# Patient Record
Sex: Female | Born: 1965 | Race: Black or African American | Hispanic: No | Smoking: Current some day smoker
Health system: Southern US, Community
[De-identification: ages and names within clinical notes are randomized; demographics above are authoritative.]

## PROBLEM LIST (undated history)

## (undated) ENCOUNTER — Emergency Department (HOSPITAL_BASED_OUTPATIENT_CLINIC_OR_DEPARTMENT_OTHER): Admission: EM | Payer: Self-pay | Source: Home / Self Care

## (undated) DIAGNOSIS — F419 Anxiety disorder, unspecified: Secondary | ICD-10-CM

## (undated) DIAGNOSIS — I10 Essential (primary) hypertension: Secondary | ICD-10-CM

## (undated) DIAGNOSIS — M549 Dorsalgia, unspecified: Secondary | ICD-10-CM

## (undated) DIAGNOSIS — M1990 Unspecified osteoarthritis, unspecified site: Secondary | ICD-10-CM

## (undated) DIAGNOSIS — A599 Trichomoniasis, unspecified: Secondary | ICD-10-CM

## (undated) DIAGNOSIS — M25569 Pain in unspecified knee: Secondary | ICD-10-CM

## (undated) DIAGNOSIS — G8929 Other chronic pain: Secondary | ICD-10-CM

## (undated) DIAGNOSIS — M2550 Pain in unspecified joint: Secondary | ICD-10-CM

## (undated) DIAGNOSIS — F1410 Cocaine abuse, uncomplicated: Secondary | ICD-10-CM

## (undated) DIAGNOSIS — E7800 Pure hypercholesterolemia, unspecified: Secondary | ICD-10-CM

## (undated) HISTORY — PX: ABDOMINAL HYSTERECTOMY: SHX81

---

## 1997-09-20 ENCOUNTER — Encounter: Admission: RE | Admit: 1997-09-20 | Discharge: 1997-09-20 | Payer: Self-pay | Admitting: Obstetrics

## 1998-01-24 ENCOUNTER — Encounter: Admission: RE | Admit: 1998-01-24 | Discharge: 1998-01-24 | Payer: Self-pay | Admitting: Obstetrics

## 1998-06-04 ENCOUNTER — Encounter: Admission: RE | Admit: 1998-06-04 | Discharge: 1998-06-04 | Payer: Self-pay | Admitting: Obstetrics & Gynecology

## 1998-06-28 ENCOUNTER — Encounter: Admission: RE | Admit: 1998-06-28 | Discharge: 1998-06-28 | Payer: Self-pay | Admitting: Obstetrics

## 2000-05-10 ENCOUNTER — Encounter: Admission: RE | Admit: 2000-05-10 | Discharge: 2000-06-10 | Payer: Self-pay | Admitting: Orthopedic Surgery

## 2013-12-26 ENCOUNTER — Other Ambulatory Visit: Payer: Self-pay | Admitting: Obstetrics & Gynecology

## 2013-12-26 ENCOUNTER — Ambulatory Visit (HOSPITAL_BASED_OUTPATIENT_CLINIC_OR_DEPARTMENT_OTHER)
Admission: RE | Admit: 2013-12-26 | Discharge: 2013-12-26 | Disposition: A | Discharge status: Home / Self Care | Payer: Self-pay | Source: Ambulatory Visit | Attending: Obstetrics & Gynecology | Admitting: Obstetrics & Gynecology

## 2013-12-26 DIAGNOSIS — Z1231 Encounter for screening mammogram for malignant neoplasm of breast: Secondary | ICD-10-CM

## 2013-12-28 ENCOUNTER — Other Ambulatory Visit: Payer: Self-pay | Admitting: Obstetrics & Gynecology

## 2013-12-28 DIAGNOSIS — R928 Other abnormal and inconclusive findings on diagnostic imaging of breast: Secondary | ICD-10-CM

## 2014-01-08 ENCOUNTER — Other Ambulatory Visit: Payer: Self-pay

## 2014-01-16 ENCOUNTER — Other Ambulatory Visit: Payer: Self-pay

## 2014-01-18 ENCOUNTER — Other Ambulatory Visit: Payer: Self-pay

## 2014-01-30 ENCOUNTER — Other Ambulatory Visit (HOSPITAL_COMMUNITY): Payer: Self-pay | Admitting: *Deleted

## 2014-01-30 DIAGNOSIS — R928 Other abnormal and inconclusive findings on diagnostic imaging of breast: Secondary | ICD-10-CM

## 2014-02-21 ENCOUNTER — Other Ambulatory Visit: Payer: Self-pay

## 2014-02-21 ENCOUNTER — Ambulatory Visit (HOSPITAL_COMMUNITY): Payer: Self-pay | Attending: Obstetrics and Gynecology

## 2014-03-20 ENCOUNTER — Other Ambulatory Visit: Payer: Self-pay

## 2014-04-04 ENCOUNTER — Other Ambulatory Visit: Payer: Self-pay

## 2014-04-04 ENCOUNTER — Ambulatory Visit (HOSPITAL_COMMUNITY): Payer: Self-pay | Attending: Obstetrics and Gynecology

## 2014-04-26 ENCOUNTER — Emergency Department (HOSPITAL_BASED_OUTPATIENT_CLINIC_OR_DEPARTMENT_OTHER)
Admission: EM | Admit: 2014-04-26 | Discharge: 2014-04-26 | Disposition: A | Discharge status: Home / Self Care | Payer: Self-pay | Attending: Emergency Medicine | Admitting: Emergency Medicine

## 2014-04-26 ENCOUNTER — Encounter (HOSPITAL_BASED_OUTPATIENT_CLINIC_OR_DEPARTMENT_OTHER): Payer: Self-pay | Admitting: *Deleted

## 2014-04-26 DIAGNOSIS — M545 Low back pain, unspecified: Secondary | ICD-10-CM

## 2014-04-26 DIAGNOSIS — Z8639 Personal history of other endocrine, nutritional and metabolic disease: Secondary | ICD-10-CM | POA: Insufficient documentation

## 2014-04-26 DIAGNOSIS — I10 Essential (primary) hypertension: Secondary | ICD-10-CM | POA: Insufficient documentation

## 2014-04-26 DIAGNOSIS — G8929 Other chronic pain: Secondary | ICD-10-CM | POA: Insufficient documentation

## 2014-04-26 DIAGNOSIS — Z720 Tobacco use: Secondary | ICD-10-CM | POA: Insufficient documentation

## 2014-04-26 HISTORY — DX: Essential (primary) hypertension: I10

## 2014-04-26 HISTORY — DX: Other chronic pain: G89.29

## 2014-04-26 HISTORY — DX: Pure hypercholesterolemia, unspecified: E78.00

## 2014-04-26 HISTORY — DX: Dorsalgia, unspecified: M54.9

## 2014-04-26 MED ORDER — HYDROMORPHONE HCL 1 MG/ML IJ SOLN
1.0000 mg | Freq: Once | INTRAMUSCULAR | Status: AC
  Administered 2014-04-26: 1 mg via INTRAMUSCULAR
  Filled 2014-04-26: qty 1

## 2014-04-26 MED ORDER — HYDROCODONE-ACETAMINOPHEN 5-325 MG PO TABS: 1.0000 | ORAL_TABLET | Freq: Four times a day (QID) | ORAL | Status: DC | PRN

## 2014-04-26 NOTE — ED Notes (Signed)
Chronic back pain. Here today for flare up. Pain goes down her left leg.

## 2014-04-26 NOTE — ED Provider Notes (Signed)
CSN: [id number]     Arrival date & time 04/26/14  1322 History   First MD Initiated Contact with Patient 04/26/14 1332     Chief Complaint  Patient presents with  . Back Pain     (Consider location/radiation/quality/duration/timing/severity/associated sxs/prior Treatment) Patient is a 49 y.o. female presenting with back pain. The history is provided by the patient.  Back Pain Location:  Lumbar spine Quality:  Aching Radiates to:  L thigh Pain severity:  Moderate Pain is:  Same all the time Onset quality:  Gradual Timing:  Constant Progression:  Worsening Chronicity:  Chronic Context comment:  Worse today Relieved by:  Nothing Worsened by:  Ambulation Ineffective treatments:  None tried Associated symptoms: no abdominal pain, no chest pain, no dysuria, no fever and no headaches     Past Medical History  Diagnosis Date  . Chronic back pain   . Hypertension   . High cholesterol    Past Surgical History  Procedure Laterality Date  . Abdominal hysterectomy     No family history on file. History  Substance Use Topics  . Smoking status: Current Some Day Smoker    Types: Cigarettes  . Smokeless tobacco: Not on file  . Alcohol Use: No   OB History    No data available     Review of Systems  Constitutional: Negative for fever and fatigue.  HENT: Negative for congestion and drooling.   Eyes: Negative for pain.  Respiratory: Negative for cough and shortness of breath.   Cardiovascular: Negative for chest pain.  Gastrointestinal: Negative for nausea, vomiting, abdominal pain and diarrhea.  Genitourinary: Negative for dysuria and hematuria.  Musculoskeletal: Negative for back pain, gait problem and neck pain.  Skin: Negative for color change.  Neurological: Negative for dizziness and headaches.  Hematological: Negative for adenopathy.  Psychiatric/Behavioral: Negative for behavioral problems.  All other systems reviewed and are negative.     Allergies  Review of  patient's allergies indicates no known allergies.  Home Medications   Prior to Admission medications   Medication Sig Start Date End Date Taking? Authorizing Provider  AmLODIPine Besylate (NORVASC PO) Take by mouth.   Yes Historical Provider, MD  Cyclobenzaprine HCl (FLEXERIL PO) Take by mouth.   Yes Historical Provider, MD  HYDROCHLOROTHIAZIDE PO Take by mouth.   Yes Historical Provider, MD  SIMVASTATIN PO Take by mouth.   Yes Historical Provider, MD  HYDROcodone-acetaminophen (NORCO) 5-325 MG per tablet Take 1 tablet by mouth every 6 (six) hours as needed for moderate pain. 04/26/14   Purvis Sheffield, MD   BP 174/115 mmHg  Pulse 87  Temp(Src) 98.6 F (37 C) (Oral)  Resp 18  Ht  (1.676 m)  Wt 202 lb (91.627 kg)  BMI 32.62 kg/m2  SpO2 99% Physical Exam  Constitutional: She is oriented to person, place, and time. She appears well-developed and well-nourished.  HENT:  Head: Normocephalic.  Mouth/Throat: Oropharynx is clear and moist. No oropharyngeal exudate.  Eyes: Conjunctivae and EOM are normal. Pupils are equal, round, and reactive to light.  Neck: Normal range of motion. Neck supple.  Cardiovascular: Normal rate, regular rhythm, normal heart sounds and intact distal pulses.  Exam reveals no gallop and no friction rub.   No murmur heard. Pulmonary/Chest: Effort normal and breath sounds normal. No respiratory distress. She has no wheezes.  Abdominal: Soft. Bowel sounds are normal. There is no tenderness. There is no rebound and no guarding.  Musculoskeletal: Normal range of motion. She exhibits tenderness. She  exhibits no edema.  Mild tenderness to palpation of the lower lumbar spine and lower paralumbar area bilaterally.  Normal strength and sensation in bilateral lower extremities.  2+ distal pulses in bilateral lower extremities.  Neurological: She is alert and oriented to person, place, and time.  Reflex Scores:      Patellar reflexes are 2+ on the right side and 2+  on the left side.      Achilles reflexes are 2+ on the right side and 2+ on the left side. Skin: Skin is warm and dry.  Psychiatric: She has a normal mood and affect. Her behavior is normal.  Nursing note and vitals reviewed.   ED Course  Procedures (including critical care time) Labs Review Labs Reviewed - No data to display  Imaging Review No results found.   EKG Interpretation None      MDM   Final diagnoses:  Acute exacerbation of chronic low back pain    1:48 PM 49 y.o. female with a history of degenerative disc disease who presents with low back pain. She notes acute worsening today. She states that it radiates down her left lateral thigh to her knee. She has had similar symptoms previously. No new injury. She states that she used to follow with a pain clinic but can no longer afford it. She states that she has a history of diabetes but no other back pain red flags. She is mildly hypertensive here but otherwise appears well. Will refer her to the wellness clinic in [place]. Back pain red flags discussed.   Medications given during this visit Medications  HYDROmorphone (DILAUDID) injection 1 mg (not administered)    New Prescriptions   HYDROCODONE-ACETAMINOPHEN (NORCO) 5-325 MG PER TABLET    Take 1 tablet by mouth every 6 (six) hours as needed for moderate pain.       Purvis SheffieldForrest Lenee Franze, MD 04/26/14 1351

## 2014-05-03 ENCOUNTER — Other Ambulatory Visit: Payer: Self-pay

## 2014-05-03 ENCOUNTER — Ambulatory Visit (HOSPITAL_COMMUNITY): Payer: Self-pay | Attending: Obstetrics and Gynecology

## 2014-10-03 ENCOUNTER — Encounter (HOSPITAL_BASED_OUTPATIENT_CLINIC_OR_DEPARTMENT_OTHER): Payer: Self-pay | Admitting: *Deleted

## 2014-10-03 ENCOUNTER — Emergency Department (HOSPITAL_BASED_OUTPATIENT_CLINIC_OR_DEPARTMENT_OTHER)
Admission: EM | Admit: 2014-10-03 | Discharge: 2014-10-04 | Disposition: A | Discharge status: Home / Self Care | Payer: Self-pay | Attending: Emergency Medicine | Admitting: Emergency Medicine

## 2014-10-03 DIAGNOSIS — I10 Essential (primary) hypertension: Secondary | ICD-10-CM | POA: Insufficient documentation

## 2014-10-03 DIAGNOSIS — F1010 Alcohol abuse, uncomplicated: Secondary | ICD-10-CM | POA: Insufficient documentation

## 2014-10-03 DIAGNOSIS — M5431 Sciatica, right side: Secondary | ICD-10-CM | POA: Insufficient documentation

## 2014-10-03 DIAGNOSIS — Z79899 Other long term (current) drug therapy: Secondary | ICD-10-CM | POA: Insufficient documentation

## 2014-10-03 DIAGNOSIS — G8929 Other chronic pain: Secondary | ICD-10-CM | POA: Insufficient documentation

## 2014-10-03 DIAGNOSIS — E780 Pure hypercholesterolemia: Secondary | ICD-10-CM | POA: Insufficient documentation

## 2014-10-03 DIAGNOSIS — Z720 Tobacco use: Secondary | ICD-10-CM | POA: Insufficient documentation

## 2014-10-03 MED ORDER — DEXAMETHASONE SODIUM PHOSPHATE 10 MG/ML IJ SOLN
10.0000 mg | Freq: Once | INTRAMUSCULAR | Status: AC
  Administered 2014-10-03: 10 mg via INTRAMUSCULAR
  Filled 2014-10-03: qty 1

## 2014-10-03 MED ORDER — KETOROLAC TROMETHAMINE 60 MG/2ML IM SOLN
60.0000 mg | Freq: Once | INTRAMUSCULAR | Status: AC
  Administered 2014-10-03: 60 mg via INTRAMUSCULAR
  Filled 2014-10-03: qty 2

## 2014-10-03 MED ORDER — METHOCARBAMOL 500 MG PO TABS
1000.0000 mg | ORAL_TABLET | Freq: Once | ORAL | Status: AC
  Administered 2014-10-03: 1000 mg via ORAL
  Filled 2014-10-03: qty 2

## 2014-10-03 NOTE — ED Provider Notes (Signed)
CSN: [id number]     Arrival date & time 10/03/14  2135 History  This chart was scribed for Ansh Fauble, MD by Lyndel Safe, ED Scribe. This patient was seen in room MH07/MH07 and the patient's care was started 11:14 PM.   Chief Complaint  Patient presents with  . Back Pain   Patient is a 49 y.o. female presenting with back pain. The history is provided by the patient. No language interpreter was used.  Back Pain Location:  Sacro-iliac joint and lumbar spine Quality:  Shooting Radiates to:  R posterior upper leg Pain severity:  Moderate Pain is:  Same all the time Onset quality:  Gradual Duration:  4 days Timing:  Constant Progression:  Unchanged Chronicity:  Chronic Context: not falling and not recent injury   Relieved by:  None tried Worsened by:  Nothing tried Ineffective treatments:  None tried Associated symptoms: no abdominal pain, no abdominal swelling, no bladder incontinence, no bowel incontinence, no chest pain, no dysuria, no fever, no headaches, no leg pain, no numbness, no paresthesias, no pelvic pain, no perianal numbness, no tingling, no weakness and no weight loss   Risk factors: no hx of cancer    HPI Comments: Amy Gibbs is a 49 y.o. female, with a PMhx of sciatica, chronic back pain, HTN, and HLD, who presents to the Emergency Department complaining of constant, moderate, shooting lower back pain that radiates down her right leg onset 4 days ago. She is prescribed flexeril and oxycodone for pain management. She was being evaluated at a pain management facility but is not currently due to insurance issues. She does not attribute any injury to the pain.   Past Medical History  Diagnosis Date  . Chronic back pain   . Hypertension   . High cholesterol    Past Surgical History  Procedure Laterality Date  . Abdominal hysterectomy     History reviewed. No pertinent family history. History  Substance Use Topics  . Smoking status: Current Some Day Smoker --  0.50 packs/day    Types: Cigarettes  . Smokeless tobacco: Not on file  . Alcohol Use: No   OB History    No data available     Review of Systems  Constitutional: Negative for fever and weight loss.  Cardiovascular: Negative for chest pain.  Gastrointestinal: Negative for abdominal pain and bowel incontinence.  Genitourinary: Negative for bladder incontinence, dysuria, difficulty urinating and pelvic pain.  Musculoskeletal: Positive for back pain and arthralgias.  Neurological: Negative for tingling, weakness, numbness, headaches and paresthesias.  All other systems reviewed and are negative.   Allergies  Review of patient's allergies indicates no known allergies.  Home Medications   Prior to Admission medications   Medication Sig Start Date End Date Taking? Authorizing Provider  AmLODIPine Besylate (NORVASC PO) Take by mouth.    Historical Provider, MD  Cyclobenzaprine HCl (FLEXERIL PO) Take by mouth.    Historical Provider, MD  HYDROCHLOROTHIAZIDE PO Take by mouth.    Historical Provider, MD  HYDROcodone-acetaminophen (NORCO) 5-325 MG per tablet Take 1 tablet by mouth every 6 (six) hours as needed for moderate pain. 04/26/14   Purvis Sheffield, MD  SIMVASTATIN PO Take by mouth.    Historical Provider, MD   BP 131/100 mmHg  Pulse 88  Temp(Src) 98.4 F (36.9 C) (Oral)  Resp 18  Ht  (1.727 m)  Wt 256 lb (116.121 kg)  BMI 38.93 kg/m2  SpO2 97% Physical Exam  Constitutional: She is oriented to person,  place, and time. She appears well-developed and well-nourished. No distress.  Pt smells strongly of alcohol.   HENT:  Head: Normocephalic and atraumatic.  Mouth/Throat: Oropharynx is clear and moist. No oropharyngeal exudate.  Eyes: Conjunctivae and EOM are normal. Pupils are equal, round, and reactive to light. Right eye exhibits no discharge. Left eye exhibits no discharge. No scleral icterus.  Neck: Normal range of motion. Neck supple. No JVD present.  Cardiovascular:  Normal rate, regular rhythm and normal heart sounds.   3+ DP pulse.   Pulmonary/Chest: Effort normal and breath sounds normal. No respiratory distress. She has no wheezes.  Abdominal: Soft. Bowel sounds are normal. She exhibits no mass. There is no tenderness. There is no rebound and no guarding.  Musculoskeletal: She exhibits no edema.       Lumbar back: She exhibits normal range of motion, no tenderness, no bony tenderness, no swelling, no edema, no deformity, no laceration, no pain, no spasm and normal pulse.  Neurological: She is alert and oriented to person, place, and time. She has normal reflexes. Coordination normal.  L5 and S1 intact perineal sensation. Good reflexes.   Skin: Skin is warm and dry. No rash noted. No erythema. No pallor.  Psychiatric: She has a normal mood and affect. Her behavior is normal.  Nursing note and vitals reviewed.   ED Course  Procedures  DIAGNOSTIC STUDIES: Oxygen Saturation is 97% on RA, normal by my interpretation.    COORDINATION OF CARE: 11:17 PM Discussed treatment plan which includes to order diagnostic imaging  with pt. Pt acknowledges and agrees to plan.   Labs Review Labs Reviewed - No data to display  Imaging Review No results found.   EKG Interpretation None      MDM   Final diagnoses:  None    Chronic pain: Will treat with toradol and robaxin.  As this is an ongoing issue there is no indication for additional imaging at this time  I personally performed the services described in this documentation, which was scribed in my presence. The recorded information has been reviewed and is accurate.     Cy BlamerApril Ricahrd Schwager, MD 10/04/14 [id number]

## 2014-10-03 NOTE — ED Notes (Signed)
Pt c/o lower back pain which radiates down right leg x 6 days

## 2014-10-04 ENCOUNTER — Encounter (HOSPITAL_BASED_OUTPATIENT_CLINIC_OR_DEPARTMENT_OTHER): Payer: Self-pay | Admitting: Emergency Medicine

## 2014-10-04 MED ORDER — METHOCARBAMOL 500 MG PO TABS
500.0000 mg | ORAL_TABLET | Freq: Two times a day (BID) | ORAL | Status: DC
Start: 2014-10-04 — End: 2015-12-26

## 2014-10-04 MED ORDER — MELOXICAM 15 MG PO TABS: 15.0000 mg | ORAL_TABLET | Freq: Every day | ORAL | Status: DC

## 2014-10-04 NOTE — Discharge Instructions (Signed)
Back Exercises These exercises may help you when beginning to rehabilitate your injury. Your symptoms may resolve with or without further involvement from your physician, physical therapist or athletic trainer. While completing these exercises, remember:   Restoring tissue flexibility helps normal motion to return to the joints. This allows healthier, less painful movement and activity.  An effective stretch should be held for at least 30 seconds.  A stretch should never be painful. You should only feel a gentle lengthening or release in the stretched tissue. STRETCH - Extension, Prone on Elbows   Lie on your stomach on the floor, a bed will be too soft. Place your palms about shoulder width apart and at the height of your head.  Place your elbows under your shoulders. If this is too painful, stack pillows under your chest.  Allow your body to relax so that your hips drop lower and make contact more completely with the floor.  Hold this position for __________ seconds.  Slowly return to lying flat on the floor. Repeat __________ times. Complete this exercise __________ times per day.  RANGE OF MOTION - Extension, Prone Press Ups   Lie on your stomach on the floor, a bed will be too soft. Place your palms about shoulder width apart and at the height of your head.  Keeping your back as relaxed as possible, slowly straighten your elbows while keeping your hips on the floor. You may adjust the placement of your hands to maximize your comfort. As you gain motion, your hands will come more underneath your shoulders.  Hold this position __________ seconds.  Slowly return to lying flat on the floor. Repeat __________ times. Complete this exercise __________ times per day.  RANGE OF MOTION- Quadruped, Neutral Spine   Assume a hands and knees position on a firm surface. Keep your hands under your shoulders and your knees under your hips. You may place padding under your knees for  comfort.  Drop your head and point your tail bone toward the ground below you. This will round out your low back like an angry cat. Hold this position for __________ seconds.  Slowly lift your head and release your tail bone so that your back sags into a large arch, like an old horse.  Hold this position for __________ seconds.  Repeat this until you feel limber in your low back.  Now, find your "sweet spot." This will be the most comfortable position somewhere between the two previous positions. This is your neutral spine. Once you have found this position, tense your stomach muscles to support your low back.  Hold this position for __________ seconds. Repeat __________ times. Complete this exercise __________ times per day.  STRETCH - Flexion, Single Knee to Chest   Lie on a firm bed or floor with both legs extended in front of you.  Keeping one leg in contact with the floor, bring your opposite knee to your chest. Hold your leg in place by either grabbing behind your thigh or at your knee.  Pull until you feel a gentle stretch in your low back. Hold __________ seconds.  Slowly release your grasp and repeat the exercise with the opposite side. Repeat __________ times. Complete this exercise __________ times per day.  STRETCH - Hamstrings, Standing  Stand or sit and extend your right / left leg, placing your foot on a chair or foot stool  Keeping a slight arch in your low back and your hips straight forward.  Lead with your chest and   lean forward at the waist until you feel a gentle stretch in the back of your right / left knee or thigh. (When done correctly, this exercise requires leaning only a small distance.)  Hold this position for __________ seconds. Repeat __________ times. Complete this stretch __________ times per day. STRENGTHENING - Deep Abdominals, Pelvic Tilt   Lie on a firm bed or floor. Keeping your legs in front of you, bend your knees so they are both pointed  toward the ceiling and your feet are flat on the floor.  Tense your lower abdominal muscles to press your low back into the floor. This motion will rotate your pelvis so that your tail bone is scooping upwards rather than pointing at your feet or into the floor.  With a gentle tension and even breathing, hold this position for __________ seconds. Repeat __________ times. Complete this exercise __________ times per day.  STRENGTHENING - Abdominals, Crunches   Lie on a firm bed or floor. Keeping your legs in front of you, bend your knees so they are both pointed toward the ceiling and your feet are flat on the floor. Cross your arms over your chest.  Slightly tip your chin down without bending your neck.  Tense your abdominals and slowly lift your trunk high enough to just clear your shoulder blades. Lifting higher can put excessive stress on the low back and does not further strengthen your abdominal muscles.  Control your return to the starting position. Repeat __________ times. Complete this exercise __________ times per day.  STRENGTHENING - Quadruped, Opposite UE/LE Lift   Assume a hands and knees position on a firm surface. Keep your hands under your shoulders and your knees under your hips. You may place padding under your knees for comfort.  Find your neutral spine and gently tense your abdominal muscles so that you can maintain this position. Your shoulders and hips should form a rectangle that is parallel with the floor and is not twisted.  Keeping your trunk steady, lift your right hand no higher than your shoulder and then your left leg no higher than your hip. Make sure you are not holding your breath. Hold this position __________ seconds.  Continuing to keep your abdominal muscles tense and your back steady, slowly return to your starting position. Repeat with the opposite arm and leg. Repeat __________ times. Complete this exercise __________ times per day. Document Released:  03/20/2005 Document Revised: 05/25/2011 Document Reviewed: 06/14/2008 [employer] Patient Information 2015 [employer]. This information is not intended to replace advice given to you by your health care provider. Make sure you discuss any questions you have with your health care provider.  

## 2015-12-26 ENCOUNTER — Encounter (HOSPITAL_BASED_OUTPATIENT_CLINIC_OR_DEPARTMENT_OTHER): Payer: Self-pay

## 2015-12-26 ENCOUNTER — Emergency Department (HOSPITAL_BASED_OUTPATIENT_CLINIC_OR_DEPARTMENT_OTHER): Payer: Self-pay

## 2015-12-26 ENCOUNTER — Inpatient Hospital Stay (HOSPITAL_BASED_OUTPATIENT_CLINIC_OR_DEPARTMENT_OTHER)
Admission: EM | Admit: 2015-12-26 | Discharge: 2016-01-02 | DRG: 546 | Disposition: A | Discharge status: Home / Self Care | Payer: Self-pay | Attending: Internal Medicine | Admitting: Internal Medicine

## 2015-12-26 DIAGNOSIS — I10 Essential (primary) hypertension: Secondary | ICD-10-CM | POA: Diagnosis present

## 2015-12-26 DIAGNOSIS — E7800 Pure hypercholesterolemia, unspecified: Secondary | ICD-10-CM | POA: Diagnosis present

## 2015-12-26 DIAGNOSIS — M25569 Pain in unspecified knee: Secondary | ICD-10-CM

## 2015-12-26 DIAGNOSIS — M02362 Reiter's disease, left knee: Secondary | ICD-10-CM | POA: Diagnosis present

## 2015-12-26 DIAGNOSIS — M0029 Other streptococcal polyarthritis: Secondary | ICD-10-CM | POA: Diagnosis present

## 2015-12-26 DIAGNOSIS — M25561 Pain in right knee: Secondary | ICD-10-CM

## 2015-12-26 DIAGNOSIS — D72829 Elevated white blood cell count, unspecified: Secondary | ICD-10-CM | POA: Diagnosis present

## 2015-12-26 DIAGNOSIS — M02361 Reiter's disease, right knee: Principal | ICD-10-CM | POA: Diagnosis present

## 2015-12-26 DIAGNOSIS — Z599 Problem related to housing and economic circumstances, unspecified: Secondary | ICD-10-CM

## 2015-12-26 DIAGNOSIS — N179 Acute kidney failure, unspecified: Secondary | ICD-10-CM | POA: Diagnosis present

## 2015-12-26 DIAGNOSIS — Z8261 Family history of arthritis: Secondary | ICD-10-CM

## 2015-12-26 DIAGNOSIS — M25461 Effusion, right knee: Secondary | ICD-10-CM

## 2015-12-26 DIAGNOSIS — Z9114 Patient's other noncompliance with medication regimen: Secondary | ICD-10-CM

## 2015-12-26 DIAGNOSIS — A599 Trichomoniasis, unspecified: Secondary | ICD-10-CM | POA: Diagnosis present

## 2015-12-26 DIAGNOSIS — J028 Acute pharyngitis due to other specified organisms: Secondary | ICD-10-CM

## 2015-12-26 DIAGNOSIS — M009 Pyogenic arthritis, unspecified: Secondary | ICD-10-CM

## 2015-12-26 DIAGNOSIS — F1410 Cocaine abuse, uncomplicated: Secondary | ICD-10-CM | POA: Diagnosis present

## 2015-12-26 DIAGNOSIS — G894 Chronic pain syndrome: Secondary | ICD-10-CM | POA: Diagnosis present

## 2015-12-26 DIAGNOSIS — Z79899 Other long term (current) drug therapy: Secondary | ICD-10-CM

## 2015-12-26 DIAGNOSIS — F419 Anxiety disorder, unspecified: Secondary | ICD-10-CM | POA: Diagnosis present

## 2015-12-26 DIAGNOSIS — M25562 Pain in left knee: Secondary | ICD-10-CM

## 2015-12-26 DIAGNOSIS — G8929 Other chronic pain: Secondary | ICD-10-CM | POA: Diagnosis present

## 2015-12-26 DIAGNOSIS — A419 Sepsis, unspecified organism: Secondary | ICD-10-CM | POA: Diagnosis present

## 2015-12-26 DIAGNOSIS — F17210 Nicotine dependence, cigarettes, uncomplicated: Secondary | ICD-10-CM | POA: Diagnosis present

## 2015-12-26 DIAGNOSIS — M25462 Effusion, left knee: Secondary | ICD-10-CM

## 2015-12-26 DIAGNOSIS — L03119 Cellulitis of unspecified part of limb: Secondary | ICD-10-CM

## 2015-12-26 HISTORY — DX: Cocaine abuse, uncomplicated: F14.10

## 2015-12-26 HISTORY — DX: Pain in unspecified joint: M25.50

## 2015-12-26 HISTORY — DX: Other chronic pain: G89.29

## 2015-12-26 HISTORY — DX: Trichomoniasis, unspecified: A59.9

## 2015-12-26 HISTORY — DX: Unspecified osteoarthritis, unspecified site: M19.90

## 2015-12-26 HISTORY — DX: Anxiety disorder, unspecified: F41.9

## 2015-12-26 HISTORY — DX: Pain in unspecified knee: M25.569

## 2015-12-26 LAB — CBC WITH DIFFERENTIAL/PLATELET
Basophils Absolute: 0 10*3/uL (ref 0.0–0.1)
Basophils Relative: 0 %
EOS ABS: 0.1 10*3/uL (ref 0.0–0.7)
EOS PCT: 1 %
HCT: 36.9 % (ref 36.0–46.0)
Hemoglobin: 12.2 g/dL (ref 12.0–15.0)
LYMPHS ABS: 3.3 10*3/uL (ref 0.7–4.0)
Lymphocytes Relative: 25 %
MCH: 30.7 pg (ref 26.0–34.0)
MCHC: 33.1 g/dL (ref 30.0–36.0)
MCV: 92.9 fL (ref 78.0–100.0)
MONO ABS: 1.2 10*3/uL — AB (ref 0.1–1.0)
Monocytes Relative: 9 %
NEUTROS ABS: 8.4 10*3/uL — AB (ref 1.7–7.7)
NEUTROS PCT: 65 %
Platelets: 331 10*3/uL (ref 150–400)
RBC: 3.97 MIL/uL (ref 3.87–5.11)
RDW: 14.7 % (ref 11.5–15.5)
WBC: 13 10*3/uL — ABNORMAL HIGH (ref 4.0–10.5)

## 2015-12-26 LAB — BASIC METABOLIC PANEL
Anion gap: 8 (ref 5–15)
BUN: 24 mg/dL — AB (ref 6–20)
CALCIUM: 9.3 mg/dL (ref 8.9–10.3)
CHLORIDE: 105 mmol/L (ref 101–111)
CO2: 25 mmol/L (ref 22–32)
CREATININE: 1.98 mg/dL — AB (ref 0.44–1.00)
GFR calc Af Amer: 33 mL/min — ABNORMAL LOW (ref >60)
GFR calc non Af Amer: 28 mL/min — ABNORMAL LOW (ref >60)
Glucose, Bld: 129 mg/dL — ABNORMAL HIGH (ref 65–99)
Potassium: 3.6 mmol/L (ref 3.5–5.1)
SODIUM: 138 mmol/L (ref 135–145)

## 2015-12-26 LAB — C-REACTIVE PROTEIN: CRP: 10.4 mg/dL — AB (ref <1.0)

## 2015-12-26 LAB — SEDIMENTATION RATE: SED RATE: 82 mm/h — AB (ref 0–22)

## 2015-12-26 MED ORDER — SODIUM CHLORIDE 0.9 % IV BOLUS (SEPSIS)
1000.0000 mL | Freq: Once | INTRAVENOUS | Status: AC
  Administered 2015-12-26: 1000 mL via INTRAVENOUS

## 2015-12-26 MED ORDER — AMLODIPINE BESYLATE 5 MG PO TABS
10.0000 mg | ORAL_TABLET | Freq: Once | ORAL | Status: AC
  Administered 2015-12-26: 10 mg via ORAL
  Filled 2015-12-26: qty 2

## 2015-12-26 MED ORDER — MORPHINE SULFATE (PF) 4 MG/ML IV SOLN
4.0000 mg | Freq: Once | INTRAVENOUS | Status: AC
  Administered 2015-12-26: 4 mg via INTRAVENOUS
  Filled 2015-12-26: qty 1

## 2015-12-26 MED ORDER — METHYLPREDNISOLONE SODIUM SUCC 125 MG IJ SOLR
125.0000 mg | Freq: Once | INTRAMUSCULAR | Status: AC
  Administered 2015-12-27: 125 mg via INTRAVENOUS
  Filled 2015-12-26: qty 2

## 2015-12-26 MED ORDER — DEXTROSE 5 % IV SOLN: 2.0000 g | Freq: Three times a day (TID) | INTRAVENOUS | Status: DC

## 2015-12-26 MED ORDER — VANCOMYCIN HCL IN DEXTROSE 1-5 GM/200ML-% IV SOLN: 1000.0000 mg | Freq: Once | INTRAVENOUS | Status: DC

## 2015-12-26 NOTE — ED Notes (Signed)
PA made aware that pt is requesting pain medication.

## 2015-12-26 NOTE — ED Notes (Signed)
Pt's BP 160/112. Pt states she hasn't had any BP medication in a long time.

## 2015-12-26 NOTE — ED Provider Notes (Signed)
[facility] Provider Note   CSN: [id number] Arrival date & time: 12/26/15  4098   By signing my name below, I, Amy Gibbs, attest that this documentation has been prepared under the direction and in the presence of non-physician practitioner, Danelle Berry, PA-C. Electronically Signed: Nelwyn Gibbs, Scribe. 12/26/2015. 8:16 PM.  History   Chief Complaint Chief Complaint  Patient presents with  . Joint Swelling   The history is provided by the patient. No language interpreter was used.    HPI Comments:  Amy Gibbs is a 50 y.o. female with PMHx of Arthritis and chronic knee pain who presents to the Emergency Department complaining of recurrent rapidly worsening constant bilateral knee pain and swelling that beginning last night. Pain is sharp and throbbing with left knee pain radiation to the back of her knee, right knee pain without radiation. Pain is rated 10/10 for the right, and 7/10 on the left. Her pain is exacerbated Movement, palpation or attempting to ambulate. She has associated erythema and warmth over both of her knees.  She has home narcotic pain medication, states there is no improvement with 10 mg of hydrocodone. She denies any street drug use. She states there is no injury prior to onset of her knee pain and swelling. She has mild cough and intermittent wheeze but she denies any shortness of breath. She denies any other pain complaints or illnesses. She denies fever, chills, sweats, nausea, vomiting. She states she has a history of arthritis in her knees with intermittent flares and swelling, however none in several years. Patient states she is unable to walk, straighten her legs or bend her legs because of the pain. She was assisted to the ER by 2 family members and she states that she is able to bear weight and transfer with her help.  No other associated or acute complaints.  Past Medical History:  Diagnosis Date  . Anxiety   . Chronic back pain   .  Chronic knee pain   . High cholesterol   . Hypertension   . Joint pain     Patient Active Problem List   Diagnosis Date Noted  . Cellulitis 12/27/2015    Past Surgical History:  Procedure Laterality Date  . ABDOMINAL HYSTERECTOMY      OB History    No data available       Home Medications    Prior to Admission medications   Medication Sig Start Date End Date Taking? Authorizing Provider  AmLODIPine Besylate (NORVASC PO) Take by mouth.    Historical Provider, MD  HYDROCHLOROTHIAZIDE PO Take by mouth.    Historical Provider, MD    Family History No family history on file.  Social History Social History  Substance Use Topics  . Smoking status: Current Some Day Smoker    Packs/day: 0.50    Types: Cigarettes  . Smokeless tobacco: Never Used  . Alcohol use No     Allergies   Review of patient's allergies indicates no known allergies.   Review of Systems Review of Systems  All other systems reviewed and are negative.    Physical Exam Updated Vital Signs BP (!) 185/114   Pulse 89   Temp 98.2 F (36.8 C) (Oral)   Resp 18   Wt 65.8 kg   SpO2 100%   BMI 22.05 kg/m   Physical Exam  Constitutional: She is oriented to person, place, and time. She appears well-developed and well-nourished. No distress.  Nontoxic appearing female, tearful, appears very uncomfortable,  appears stated age  HENT:  Head: Normocephalic and atraumatic.  Right Ear: External ear normal.  Left Ear: External ear normal.  Nose: Nose normal.  Mouth/Throat: Oropharynx is clear and moist.  Eyes: Conjunctivae and EOM are normal. Pupils are equal, round, and reactive to light. Right eye exhibits no discharge. Left eye exhibits no discharge. No scleral icterus.  Neck: Normal range of motion.  Cardiovascular: Normal rate, regular rhythm, normal heart sounds and intact distal pulses.  Exam reveals no gallop and no friction rub.   No murmur heard. Pulmonary/Chest: Effort normal. No stridor.  No respiratory distress. She has wheezes. She has no rales. She exhibits no tenderness.  Abdominal: Soft. Bowel sounds are normal. She exhibits no distension and no mass. There is no tenderness. There is no rebound and no guarding.  Musculoskeletal: She exhibits edema and tenderness.       Right knee: She exhibits decreased range of motion, swelling, effusion and erythema. Tenderness found.       Left knee: She exhibits decreased range of motion, swelling, effusion and erythema. Tenderness found.  Bilateral knees diffusely swollen with edema and erythema, warmth to the touch. Generalized tenderness to palpation. Patient will not fully extend either leg, ROM testing not tolerated by pt secondary to pain  Neurological: She is alert and oriented to person, place, and time. She exhibits normal muscle tone. Coordination normal.  Skin: Skin is warm, dry and intact. Capillary refill takes less than 2 seconds. No rash noted. She is not diaphoretic. There is erythema. No cyanosis. No pallor. Nails show no clubbing.  Psychiatric: She has a normal mood and affect. Her behavior is normal. Judgment and thought content normal.  Nursing note and vitals reviewed.    ED Treatments / Results  DIAGNOSTIC STUDIES:  Oxygen Saturation is 100% on RA, normal by my interpretation.    COORDINATION OF CARE:  8:48 PM Discussed treatment plan with pt at bedside which included imaging and pain medication and pt agreed to plan.  Labs (all labs ordered are listed, but only abnormal results are displayed) Labs Reviewed  CBC WITH DIFFERENTIAL/PLATELET - Abnormal; Notable for the following:       Result Value   WBC 13.0 (*)    Neutro Abs 8.4 (*)    Monocytes Absolute 1.2 (*)    All other components within normal limits  BASIC METABOLIC PANEL - Abnormal; Notable for the following:    Glucose, Bld 129 (*)    BUN 24 (*)    Creatinine, Ser 1.98 (*)    GFR calc non Af Amer 28 (*)    GFR calc Af Amer 33 (*)    All other  components within normal limits  C-REACTIVE PROTEIN - Abnormal; Notable for the following:    CRP 10.4 (*)    All other components within normal limits  SEDIMENTATION RATE - Abnormal; Notable for the following:    Sed Rate 82 (*)    All other components within normal limits  URINALYSIS, ROUTINE W REFLEX MICROSCOPIC (NOT AT [facility])  RAPID URINE DRUG SCREEN, HOSP PERFORMED    EKG  EKG Interpretation None       Radiology Dg Chest 2 View  Result Date: 12/27/2015 CLINICAL DATA:  Wheezing.  Chest pain and cough. EXAM: CHEST  2 VIEW COMPARISON:  None. FINDINGS: Lung volumes are low. Prominence of the cardiac silhouette likely accentuated by low lung volumes. There is tortuosity of the thoracic aorta. No focal airspace disease, pulmonary edema or pleural fluid.  No pneumothorax. No acute osseous abnormality is seen. IMPRESSION: Hypoventilatory chest. Prominent cardiac silhouette is likely accentuated by low lung volumes. Electronically Signed   By: Rubye OaksMelanie  Ehinger M.D.   On: 12/27/2015 00:17   Dg Knee Complete 4 Views Left  Result Date: 12/26/2015 CLINICAL DATA:  Left knee pain and swelling.  Acute on chronic pain. EXAM: LEFT KNEE - COMPLETE 4+ VIEW COMPARISON:  None. FINDINGS: Mild tricompartmental osteoarthritis with peripheral spurring. No acute fracture or subluxation. Fragmentation of the anterior tibial tubercle. No bony destructive change. There is a large knee joint effusion. IMPRESSION: Large knee joint effusion with mild tricompartmental osteoarthritis. No acute bony abnormality. Electronically Signed   By: Rubye OaksMelanie  Ehinger M.D.   On: 12/26/2015 22:29   Dg Knee Complete 4 Views Right  Result Date: 12/26/2015 CLINICAL DATA:  History of arthritis, chronic knee pain EXAM: RIGHT KNEE - COMPLETE 4+ VIEW COMPARISON:  None. FINDINGS: There is mild narrowing and spurring of the medial compartment. No acute fracture or dislocation. Large suprapatellar joint effusion. IMPRESSION: 1. No acute  fracture. 2. Large suprapatellar joint effusion. Electronically Signed   By: Jasmine PangKim  Fujinaga M.D.   On: 12/26/2015 22:28    Procedures Procedures (including critical care time)  Medications Ordered in ED Medications  vancomycin (VANCOCIN) IVPB 1000 mg/200 mL premix (1,000 mg Intravenous New Bag/Given 12/27/15 0049)  ceFEPIme (MAXIPIME) 2 g injection (not administered)  vancomycin (VANCOCIN) IVPB 1000 mg/200 mL premix (not administered)  ceFEPIme (MAXIPIME) 2 g in dextrose 5 % 50 mL IVPB (not administered)  sodium chloride 0.9 % bolus 1,000 mL (0 mLs Intravenous Stopped 12/26/15 2200)  morphine 4 MG/ML injection 4 mg (4 mg Intravenous Given 12/26/15 2105)  morphine 4 MG/ML injection 4 mg (4 mg Intravenous Given 12/26/15 2322)  amLODipine (NORVASC) tablet 10 mg (10 mg Oral Given 12/26/15 2328)  methylPREDNISolone sodium succinate (SOLU-MEDROL) 125 mg/2 mL injection 125 mg (125 mg Intravenous Given 12/27/15 0012)  hydrALAZINE (APRESOLINE) injection 10 mg (10 mg Intravenous Given 12/27/15 0012)  ceFEPIme (MAXIPIME) 2 g in dextrose 5 % 50 mL IVPB (0 g Intravenous Stopped 12/27/15 0048)     Initial Impression / Assessment and Plan / ED Course  I have reviewed the triage vital signs and the nursing notes.  Pertinent labs & imaging results that were available during my care of the patient were reviewed by me and considered in my medical decision making (see chart for details).  Clinical Course  Patient with bilateral knee pain, swelling with effusion, erythema and warmth, she states this began overnight without any inciting injury.  Patient has limited range of motion on exam, patient unable to ambulate secondary to pain, but states that she can bear weight temporarily to transfer with assistance, and on exam she is very tender to palpation with mild erythema.  Concern for infectious versus inflammatory arthritis.  Basic lab work obtained and x-rays of bilateral knees. Can only find one right knee  x-ray and care everywhere from 2014.  Patient has mild leukocytosis of 13,000, elevated BUN/creatinine, sedimentation rate elevated, CRP is a send out lab and is pending currently.  X-ray of right knee is significant for large suprapatellar joint effusion, no acute fractures or osseous injuries. Mild narrowing and spurring of medial compartment. The left knee is significant for mild tricompartmental osteoarthritis with peripheral spurring, no acute fractures or subluxation. Large knee joint effusion.  Patient is also hypertensive, is not compliant with her home medications, and suspect some of her hypertension is secondary  to her pain.  She was given an additional dose of IV morphine, given her home medication of amlodipine, will monitor blood pressure, and treat pain more aggressively. She denies headache, nausea, vomiting, chest pain, shortness of breath, however with AKI,  feel she will need further monitoring and treatment.  Case discussed with attending EDP, Dr. Rush Landmark, who agrees with assessment and plan to admit.  Will consult ortho and have medicine admit.  Per Dr. Rush Landmark, will give 10 mg IV hydralazine.  12:12 AM Per pharmacy consult with Fayrene Fearing at [facility] ED, ok to given 1g vanc IV and 2g cefepime IV, and they will dose and monitor when pt is admitted  12:30 AM Spoke with Dr. Renaye Rakers for ortho consult, he will see the patient tomorrow morning at [facility]  1:18 AM [facility] hospitalist consulted admission, Dr. Robb Matar to admit to inpatient telemetry for cellulitis with rule out septic arthritis, also admitted to inpt for AKI, and hypertension..  Per Dr. Robb Matar, he would like to monitor BP, give clonidine PRN.  UDS and UA pending, pt has hx of cocaine use, will avoid beta blockers.  She states last use was 2 weeks ago.  1:37 AM Patient is currently resting comfortably with IV antibiotics being administered.  Updated regarding plan for admission.  In room at time of reevaluation BP 153/105,  improving. When necessary pain meds, breathing treatments and antibiotics ordered.   Final Clinical Impressions(s) / ED Diagnoses   Final diagnoses:  AKI (acute kidney injury) (HCC)  Pain and swelling of right knee  Pain and swelling of left knee  Cellulitis of lower extremity, unspecified laterality  Hypertension, unspecified type    New Prescriptions New Prescriptions   No medications on file   I personally performed the services described in this documentation, which was scribed in my presence. The recorded information has been reviewed and is accurate.      Danelle Berry, PA-C 12/27/15 0142    Canary Brim Tegeler, MD 12/27/15 [phone]

## 2015-12-26 NOTE — ED Notes (Signed)
Pt assisted on and off bedpan. Pt requesting pain mediation. PA made aware.

## 2015-12-26 NOTE — ED Triage Notes (Signed)
C/o bilat knee swellign x today-hx of same "2-3 x per month for years"-denies injury-presents to triage in w/c-states ambulates w/o assist

## 2015-12-26 NOTE — ED Notes (Signed)
Pt called out asking for pain medication. Pt has not been evaluated by PA and will have to wait for PA to evaluate and give orders. Pt made aware.

## 2015-12-26 NOTE — ED Notes (Signed)
Patient transported to X-ray 

## 2015-12-27 ENCOUNTER — Inpatient Hospital Stay (HOSPITAL_COMMUNITY): Payer: Self-pay | Admitting: Anesthesiology

## 2015-12-27 ENCOUNTER — Encounter (HOSPITAL_BASED_OUTPATIENT_CLINIC_OR_DEPARTMENT_OTHER): Payer: Self-pay | Admitting: Emergency Medicine

## 2015-12-27 ENCOUNTER — Inpatient Hospital Stay (HOSPITAL_COMMUNITY): Payer: Self-pay

## 2015-12-27 DIAGNOSIS — E7800 Pure hypercholesterolemia, unspecified: Secondary | ICD-10-CM | POA: Diagnosis present

## 2015-12-27 DIAGNOSIS — M0029 Other streptococcal polyarthritis: Secondary | ICD-10-CM | POA: Diagnosis present

## 2015-12-27 DIAGNOSIS — M009 Pyogenic arthritis, unspecified: Secondary | ICD-10-CM

## 2015-12-27 DIAGNOSIS — M25569 Pain in unspecified knee: Secondary | ICD-10-CM

## 2015-12-27 DIAGNOSIS — I10 Essential (primary) hypertension: Secondary | ICD-10-CM | POA: Diagnosis present

## 2015-12-27 DIAGNOSIS — F419 Anxiety disorder, unspecified: Secondary | ICD-10-CM | POA: Diagnosis present

## 2015-12-27 DIAGNOSIS — F1410 Cocaine abuse, uncomplicated: Secondary | ICD-10-CM | POA: Diagnosis present

## 2015-12-27 DIAGNOSIS — L0390 Cellulitis, unspecified: Secondary | ICD-10-CM

## 2015-12-27 DIAGNOSIS — D72829 Elevated white blood cell count, unspecified: Secondary | ICD-10-CM | POA: Diagnosis present

## 2015-12-27 DIAGNOSIS — G8929 Other chronic pain: Secondary | ICD-10-CM | POA: Diagnosis present

## 2015-12-27 DIAGNOSIS — N179 Acute kidney failure, unspecified: Secondary | ICD-10-CM | POA: Diagnosis present

## 2015-12-27 LAB — URINE MICROSCOPIC-ADD ON

## 2015-12-27 LAB — SYNOVIAL CELL COUNT + DIFF, W/ CRYSTALS
Crystals, Fluid: NONE SEEN
Crystals, Fluid: NONE SEEN
EOSINOPHILS-SYNOVIAL: 0 % (ref 0–1)
EOSINOPHILS-SYNOVIAL: 0 % (ref 0–1)
Lymphocytes-Synovial Fld: 2 % (ref 0–20)
Lymphocytes-Synovial Fld: 4 % (ref 0–20)
MONOCYTE-MACROPHAGE-SYNOVIAL FLUID: 2 % — AB (ref 50–90)
MONOCYTE-MACROPHAGE-SYNOVIAL FLUID: 2 % — AB (ref 50–90)
NEUTROPHIL, SYNOVIAL: 96 % — AB (ref 0–25)
Neutrophil, Synovial: 94 % — ABNORMAL HIGH (ref 0–25)
WBC, SYNOVIAL: 6800 /mm3 — AB (ref 0–200)
WBC, Synovial: 10700 /mm3 — ABNORMAL HIGH (ref 0–200)

## 2015-12-27 LAB — URINALYSIS, ROUTINE W REFLEX MICROSCOPIC
Bilirubin Urine: NEGATIVE
Glucose, UA: 100 mg/dL — AB
HGB URINE DIPSTICK: NEGATIVE
Ketones, ur: NEGATIVE mg/dL
LEUKOCYTES UA: NEGATIVE
Nitrite: NEGATIVE
PH: 6.5 (ref 5.0–8.0)
PROTEIN: 30 mg/dL — AB
Specific Gravity, Urine: 1.01 (ref 1.005–1.030)

## 2015-12-27 LAB — GRAM STAIN

## 2015-12-27 LAB — RAPID URINE DRUG SCREEN, HOSP PERFORMED
AMPHETAMINES: NOT DETECTED
BARBITURATES: NOT DETECTED
BENZODIAZEPINES: NOT DETECTED
Cocaine: POSITIVE — AB
Opiates: POSITIVE — AB
Tetrahydrocannabinol: NOT DETECTED

## 2015-12-27 MED ORDER — SODIUM CHLORIDE 0.9 % IV SOLN
INTRAVENOUS | Status: AC
  Administered 2015-12-27: 09:00:00 via INTRAVENOUS

## 2015-12-27 MED ORDER — PNEUMOCOCCAL VAC POLYVALENT 25 MCG/0.5ML IJ INJ
0.5000 mL | INJECTION | INTRAMUSCULAR | Status: AC
  Administered 2015-12-28: 0.5 mL via INTRAMUSCULAR
  Filled 2015-12-27: qty 0.5

## 2015-12-27 MED ORDER — HYDRALAZINE HCL 20 MG/ML IJ SOLN
10.0000 mg | Freq: Four times a day (QID) | INTRAMUSCULAR | Status: DC | PRN
  Administered 2015-12-31: 10 mg via INTRAVENOUS
  Filled 2015-12-27: qty 1

## 2015-12-27 MED ORDER — ONDANSETRON HCL 4 MG PO TABS: 4.0000 mg | ORAL_TABLET | Freq: Four times a day (QID) | ORAL | Status: DC | PRN

## 2015-12-27 MED ORDER — SENNOSIDES-DOCUSATE SODIUM 8.6-50 MG PO TABS
1.0000 | ORAL_TABLET | Freq: Every evening | ORAL | Status: DC | PRN
  Administered 2015-12-31: 1 via ORAL
  Filled 2015-12-27: qty 1

## 2015-12-27 MED ORDER — CEFEPIME HCL 2 G IJ SOLR
INTRAMUSCULAR | Status: AC
  Filled 2015-12-27: qty 2

## 2015-12-27 MED ORDER — LIDOCAINE HCL (PF) 1 % IJ SOLN
INTRAMUSCULAR | Status: AC
  Administered 2015-12-27: 30 mL
  Filled 2015-12-27: qty 30

## 2015-12-27 MED ORDER — ACETAMINOPHEN 325 MG PO TABS
650.0000 mg | ORAL_TABLET | Freq: Four times a day (QID) | ORAL | Status: DC | PRN
  Filled 2015-12-27: qty 2

## 2015-12-27 MED ORDER — HYDRALAZINE HCL 20 MG/ML IJ SOLN
10.0000 mg | INTRAMUSCULAR | Status: AC
  Administered 2015-12-27: 10 mg via INTRAVENOUS
  Filled 2015-12-27: qty 1

## 2015-12-27 MED ORDER — VANCOMYCIN HCL IN DEXTROSE 1-5 GM/200ML-% IV SOLN
1000.0000 mg | INTRAVENOUS | Status: DC
  Administered 2015-12-27 – 2015-12-29 (×3): 1000 mg via INTRAVENOUS
  Filled 2015-12-27 (×4): qty 200

## 2015-12-27 MED ORDER — ENOXAPARIN SODIUM 40 MG/0.4ML ~~LOC~~ SOLN
40.0000 mg | SUBCUTANEOUS | Status: DC
  Administered 2015-12-27 – 2016-01-02 (×7): 40 mg via SUBCUTANEOUS
  Filled 2015-12-27 (×7): qty 0.4

## 2015-12-27 MED ORDER — ALBUTEROL SULFATE (2.5 MG/3ML) 0.083% IN NEBU: 2.5000 mg | INHALATION_SOLUTION | RESPIRATORY_TRACT | Status: AC | PRN

## 2015-12-27 MED ORDER — KETOROLAC TROMETHAMINE 30 MG/ML IJ SOLN
30.0000 mg | Freq: Four times a day (QID) | INTRAMUSCULAR | Status: AC | PRN
  Administered 2015-12-27 – 2015-12-28 (×4): 30 mg via INTRAVENOUS
  Filled 2015-12-27 (×5): qty 1

## 2015-12-27 MED ORDER — HYDROMORPHONE HCL 1 MG/ML IJ SOLN: 1.0000 mg | INTRAMUSCULAR | Status: DC | PRN

## 2015-12-27 MED ORDER — INFLUENZA VAC SPLIT QUAD 0.5 ML IM SUSY
0.5000 mL | PREFILLED_SYRINGE | INTRAMUSCULAR | Status: AC
  Administered 2015-12-28: 0.5 mL via INTRAMUSCULAR
  Filled 2015-12-27: qty 0.5

## 2015-12-27 MED ORDER — VANCOMYCIN HCL IN DEXTROSE 1-5 GM/200ML-% IV SOLN
1000.0000 mg | Freq: Once | INTRAVENOUS | Status: AC
  Administered 2015-12-27: 1000 mg via INTRAVENOUS
  Filled 2015-12-27: qty 200

## 2015-12-27 MED ORDER — HYDROMORPHONE HCL 1 MG/ML IJ SOLN: 0.5000 mg | Freq: Once | INTRAMUSCULAR | Status: DC | PRN

## 2015-12-27 MED ORDER — ACETAMINOPHEN 650 MG RE SUPP: 650.0000 mg | Freq: Four times a day (QID) | RECTAL | Status: DC | PRN

## 2015-12-27 MED ORDER — HYDROMORPHONE HCL 1 MG/ML IJ SOLN: 1.0000 mg | Freq: Once | INTRAMUSCULAR | Status: DC | PRN

## 2015-12-27 MED ORDER — ONDANSETRON HCL 4 MG/2ML IJ SOLN: 4.0000 mg | Freq: Three times a day (TID) | INTRAMUSCULAR | Status: DC | PRN

## 2015-12-27 MED ORDER — DEXTROSE 5 % IV SOLN
2.0000 g | INTRAVENOUS | Status: DC
  Administered 2015-12-27: 2 g via INTRAVENOUS
  Filled 2015-12-27 (×2): qty 2

## 2015-12-27 MED ORDER — ONDANSETRON HCL 4 MG/2ML IJ SOLN: 4.0000 mg | Freq: Four times a day (QID) | INTRAMUSCULAR | Status: DC | PRN

## 2015-12-27 MED ORDER — DEXTROSE 5 % IV SOLN
2.0000 g | Freq: Once | INTRAVENOUS | Status: AC
  Administered 2015-12-27: 2 g via INTRAVENOUS

## 2015-12-27 NOTE — Consult Note (Signed)
ORTHOPAEDIC CONSULTATION  REQUESTING PHYSICIAN: Haydee SalterPhillip M Hobbs, MD  Chief Complaint: B/L knee pain  Assessment: Principal Problem:   Cellulitis Active Problems:   Hypertension   High cholesterol   Cocaine abuse   Chronic knee pain   Anxiety   Acute kidney injury (HCC)   Leukocytosis  B/L knee pain x3-4 days without injury.  Denies Hx of gout.   Her knees do not look overtly infected - this may be an exacerbation of OA (although this is mild by XR studies) or some other crystalline arthropathy.  Infection is not fully ruled out however.  Aspiration and fluid analysis would aid in this determination.  Aspiration would also likely reduce pressure / pain.  The patient declines aspiration dt dislike of needles after repeated counseling regarding same to aid in dx and tx of her knee pain.  The potential of risk to life and limb should she have infection in the knee was discussed at length.  She verbalizes understanding and still declines aspiration.  Plan: Recommend limiting sedating medications to facilitate interaction with the patient. We recommend aspiration to aid in diagnosis / treatment. I/D of her knee(s) would be appropriate should aspiration suggest same.  HPI: Amy Gibbs is a 50 y.o. female who complains of b/l knee pain for the past 3-4 days.  She has pain limiting ambulation and ROM.  Denies injury.  She denies fever or systemic symptoms.  Denies history of this condition.  No reported history of gout.  She has been taking hydrocodone 10 mg w/o relief.    Orthopedics was consulted to assist with evaluation.  Past Medical History:  Diagnosis Date  . Anxiety   . Arthritis   . Chronic back pain   . Chronic knee pain   . Cocaine abuse   . High cholesterol   . Hypertension   . Joint pain    Past Surgical History:  Procedure Laterality Date  . ABDOMINAL HYSTERECTOMY     Social History   Social History  . Marital status: Married    Spouse name: N/A    . Number of children: N/A  . Years of education: N/A   Social History Main Topics  . Smoking status: Current Some Day Smoker    Packs/day: 0.50    Types: Cigarettes  . Smokeless tobacco: Never Used  . Alcohol use No  . Drug use:     Types: Cocaine  . Sexual activity: Not Asked   Other Topics Concern  . None   Social History Narrative  . None   History reviewed. No pertinent family history. No Known Allergies Prior to Admission medications   Medication Sig Start Date End Date Taking? Authorizing Provider  AmLODIPine Besylate (NORVASC PO) Take by mouth.    Historical Provider, MD  HYDROCHLOROTHIAZIDE PO Take by mouth.    Historical Provider, MD   Dg Chest 2 View  Result Date: 12/27/2015 CLINICAL DATA:  Wheezing.  Chest pain and cough. EXAM: CHEST  2 VIEW COMPARISON:  None. FINDINGS: Lung volumes are low. Prominence of the cardiac silhouette likely accentuated by low lung volumes. There is tortuosity of the thoracic aorta. No focal airspace disease, pulmonary edema or pleural fluid. No pneumothorax. No acute osseous abnormality is seen. IMPRESSION: Hypoventilatory chest. Prominent cardiac silhouette is likely accentuated by low lung volumes. Electronically Signed   By: Rubye OaksMelanie  Ehinger M.D.   On: 12/27/2015 00:17   Dg Knee Complete 4 Views Left  Result Date: 12/26/2015 CLINICAL DATA:  Left knee pain and swelling.  Acute on chronic pain. EXAM: LEFT KNEE - COMPLETE 4+ VIEW COMPARISON:  None. FINDINGS: Mild tricompartmental osteoarthritis with peripheral spurring. No acute fracture or subluxation. Fragmentation of the anterior tibial tubercle. No bony destructive change. There is a large knee joint effusion. IMPRESSION: Large knee joint effusion with mild tricompartmental osteoarthritis. No acute bony abnormality. Electronically Signed   By: Rubye Oaks M.D.   On: 12/26/2015 22:29   Dg Knee Complete 4 Views Right  Result Date: 12/26/2015 CLINICAL DATA:  History of arthritis,  chronic knee pain EXAM: RIGHT KNEE - COMPLETE 4+ VIEW COMPARISON:  None. FINDINGS: There is mild narrowing and spurring of the medial compartment. No acute fracture or dislocation. Large suprapatellar joint effusion. IMPRESSION: 1. No acute fracture. 2. Large suprapatellar joint effusion. Electronically Signed   By: Jasmine Pang M.D.   On: 12/26/2015 22:28    Positive ROS: All other systems have been reviewed and were otherwise negative with the exception of those mentioned in the HPI and as above.  Objective: Labs cbc  Recent Labs  12/26/15 2050  WBC 13.0*  HGB 12.2  HCT 36.9  PLT 331    Labs inflam  Recent Labs  12/26/15 2050  CRP 10.4*    Recent Labs  12/26/15 2050  NA 138  K 3.6  CL 105  CO2 25  GLUCOSE 129*  BUN 24*  CREATININE 1.98*  CALCIUM 9.3    Physical Exam: Vitals:   12/27/15 0353 12/27/15 0731  BP: (!) 153/99 (!) 150/94  Pulse: 96 96  Resp: 18 17  Temp: 98.8 F (37.1 C) 97.9 F (36.6 C)   General: Somnolent.  Not toxic appearing.  Difficult to awake - she does respond appropriately to questions. Mental status: Alert and Oriented x3 Neurologic: Speech Clear and organized, no gross focal findings or movement disorder appreciated. Respiratory: No cyanosis, no use of accessory musculature Cardiovascular: No pedal edema GI: Abdomen is soft and non-tender, non-distended. Skin: Warm and dry.  No lesions in the area of chief complaint  Extremities: Warm and well perfused w/o edema  MUSCULOSKELETAL:  B/L knees with significant effusion, without erythema, lesion or overt outward sign of infection. Diffuse tenderness to palpation with ROM limited dt pain and swelling. Other extremities are atraumatic with painless ROM and NVI.   Albina Billet III PA-C 12/27/2015 8:20 AM

## 2015-12-27 NOTE — ED Notes (Signed)
PA made aware of BP 170/108. No new orders received. BP trending down.

## 2015-12-27 NOTE — Progress Notes (Addendum)
The patient was re-visited this afternoon and was much more Awake, alert and cooperative.  She consented to aspiration of both knees after reasoning, risks, and benefits were discussed with Dr. Wandra Feinstein. Murphy.  Approximately 60  And 90 ml of Slightly cloudy serous / serosanguinous fluid was aspirated from the right and left knees respectively.  She tolerated this well without complication.  Samples sent for analysis.  Dr. Wandra Feinstein. Murphy will discuss the case with Dr. Carola FrostHandy who will be on call this weekend.  The possibility and details of I/D arthroscopy was also discussed.  She will be NPO after midnight.  Albina BilletHenry Calvin Martensen III PA-C 12/27/2015 1428 PM

## 2015-12-27 NOTE — ED Notes (Signed)
Pt very drowsy after medications. Awakens to gentle shake and then falls asleep again.

## 2015-12-27 NOTE — ED Notes (Signed)
Pt given soup and juice per request and permission from PA.

## 2015-12-27 NOTE — H&P (Signed)
History and Physical    Amy Gibbs WUJ:[id number] DOB: 1965-06-08 DOA: 12/26/2015  PCP: No PCP Per Patient Patient coming from: home/[facility]  Chief Complaint: bilateral knee pain and swelling  HPI: Amy Gibbs is a 50 y.o. female with medical history significant for hypertension cocaine abuse chronic back pain anxiety presents to 6 E. room 30 from [facility] chief complaint bilateral knee pain and swelling. Initial workup reveals acute renal failure, bilateral effusions at knees, mild leukocytosis concern for septic arthritis versus cellulitis.  Information is obtained from the chart as the patient is too obtunded to produce a patent. Chart review indicates she developed recurrent rapidly worsening bilateral knee pain and swelling yesterday. Describes the pain as sharp and throbbing. She states on the left side it radiates to the back of her knee on the right there is no radiation of the pain. She rates the pain a 10 out of 10 on the right and 7 out of 10 on the left. Moving makes the pain worse ambulating makes the pain worse. Associated symptoms include swelling and he ate. She also reports she took hydrocodone with no relief. She denies any recent injury or fall. She denies fever chills nausea vomiting dysuria hematuria frequency or urgency. Report of any chest pain palpitations shortness of breath cough. At her  [facility] she was unable to walk.    ED Course: The emergency department she's afebrile hemodynamically stable and not hypoxic she is quite drowsy.  Review of Systems: As per HPI otherwise 10 point review of systems negative.   Ambulatory Status: Nellie she ambulates independently with no recent falls  Past Medical History:  Diagnosis Date  . Anxiety   . Arthritis   . Chronic back pain   . Chronic knee pain   . Cocaine abuse   . High cholesterol   . Hypertension   . Joint pain     Past Surgical History:  Procedure Laterality  Date  . ABDOMINAL HYSTERECTOMY      Social History   Social History  . Marital status: Married    Spouse name: N/A  . Number of children: N/A  . Years of education: N/A   Occupational History  . Not on file.   Social History Main Topics  . Smoking status: Current Some Day Smoker    Packs/day: 0.50    Types: Cigarettes  . Smokeless tobacco: Never Used  . Alcohol use No  . Drug use:     Types: Cocaine  . Sexual activity: Not on file   Other Topics Concern  . Not on file   Social History Narrative  . No narrative on file    No Known Allergies  History reviewed. No pertinent family history. Unable to obtain due to patient's drowsiness  Prior to Admission medications   Medication Sig Start Date End Date Taking? Authorizing Provider  AmLODIPine Besylate (NORVASC PO) Take by mouth.    Historical Provider, MD  HYDROCHLOROTHIAZIDE PO Take by mouth.    Historical Provider, MD    Physical Exam: Vitals:   12/27/15 0200 12/27/15 0245 12/27/15 0353 12/27/15 0731  BP: (!) 170/108 176/99 (!) 153/99 (!) 150/94  Pulse:  91 96 96  Resp:  16 18 17   Temp:  98.4 F (36.9 C) 98.8 F (37.1 C) 97.9 F (36.6 C)  TempSrc:  Oral Oral Axillary  SpO2:  98% 98% 97%  Weight:   72.1 kg (158 lb 14.4 oz)   Height:  5\' 7"  (1.702 m)      General:  Appears Quite drowsy will arouse to vigorous sternal rub Eyes:  PERRL, EOMI, normal lids, iris ENT:  grossly normal hearing, lips & tongue, mucous membranes of her mouth are moist and pink Neck:  no LAD, masses or thyromegaly Cardiovascular:  RRR, no m/r/g. No LE edema.  Respiratory: Snoring respirations breath sounds somewhat coarse throughout no wheeze no crackles Abdomen:  soft, ntnd, obese soft positive bowel sounds no guarding or rebounding Skin:  no rash or induration seen on limited exam Musculoskeletal:  grossly normal tone BUE/BLE, good ROM, no bony abnormality. Bilateral knees with erythema and warmth left greater than right. She  arouses with some pain at left knee being palpated. No will response to right knee being palpated. Passive range of motion without response Psychiatric:  grossly normal mood and affect, speech fluent and appropriate, AOx3 Neurologic:  Quite drowsy. Attempts to follow commands.  Labs on Admission: I have personally reviewed following labs and imaging studies  CBC:  Recent Labs Lab 12/26/15 2050  WBC 13.0*  NEUTROABS 8.4*  HGB 12.2  HCT 36.9  MCV 92.9  PLT 331   Basic Metabolic Panel:  Recent Labs Lab 12/26/15 2050  NA 138  K 3.6  CL 105  CO2 25  GLUCOSE 129*  BUN 24*  CREATININE 1.98*  CALCIUM 9.3   GFR: Estimated Creatinine Clearance: 33.4 mL/min (by C-G formula based on SCr of 1.98 mg/dL (H)). Liver Function Tests: No results for input(s): AST, ALT, ALKPHOS, BILITOT, PROT, ALBUMIN in the last 168 hours. No results for input(s): LIPASE, AMYLASE in the last 168 hours. No results for input(s): AMMONIA in the last 168 hours. Coagulation Profile: No results for input(s): INR, PROTIME in the last 168 hours. Cardiac Enzymes: No results for input(s): CKTOTAL, CKMB, CKMBINDEX, TROPONINI in the last 168 hours. BNP (last 3 results) No results for input(s): PROBNP in the last 8760 hours. HbA1C: No results for input(s): HGBA1C in the last 72 hours. CBG: No results for input(s): GLUCAP in the last 168 hours. Lipid Profile: No results for input(s): CHOL, HDL, LDLCALC, TRIG, CHOLHDL, LDLDIRECT in the last 72 hours. Thyroid Function Tests: No results for input(s): TSH, T4TOTAL, FREET4, T3FREE, THYROIDAB in the last 72 hours. Anemia Panel: No results for input(s): VITAMINB12, FOLATE, FERRITIN, TIBC, IRON, RETICCTPCT in the last 72 hours. Urine analysis:    Component Value Date/Time   COLORURINE YELLOW 12/27/2015 0130   APPEARANCEUR CLEAR 12/27/2015 0130   LABSPEC 1.010 12/27/2015 0130   PHURINE 6.5 12/27/2015 0130   GLUCOSEU 100 (A) 12/27/2015 0130   HGBUR NEGATIVE  12/27/2015 0130   BILIRUBINUR NEGATIVE 12/27/2015 0130   KETONESUR NEGATIVE 12/27/2015 0130   PROTEINUR 30 (A) 12/27/2015 0130   NITRITE NEGATIVE 12/27/2015 0130   LEUKOCYTESUR NEGATIVE 12/27/2015 0130    Creatinine Clearance: Estimated Creatinine Clearance: 33.4 mL/min (by C-G formula based on SCr of 1.98 mg/dL (H)).  Sepsis Labs: @LABRCNTIP (procalcitonin:4,lacticidven:4) )No results found for this or any previous visit (from the past 240 hour(s)).   Radiological Exams on Admission: Dg Chest 2 View  Result Date: 12/27/2015 CLINICAL DATA:  Wheezing.  Chest pain and cough. EXAM: CHEST  2 VIEW COMPARISON:  None. FINDINGS: Lung volumes are low. Prominence of the cardiac silhouette likely accentuated by low lung volumes. There is tortuosity of the thoracic aorta. No focal airspace disease, pulmonary edema or pleural fluid. No pneumothorax. No acute osseous abnormality is seen. IMPRESSION: Hypoventilatory chest. Prominent cardiac silhouette is  likely accentuated by low lung volumes. Electronically Signed   By: Rubye OaksMelanie  Ehinger M.D.   On: 12/27/2015 00:17   Dg Knee Complete 4 Views Left  Result Date: 12/26/2015 CLINICAL DATA:  Left knee pain and swelling.  Acute on chronic pain. EXAM: LEFT KNEE - COMPLETE 4+ VIEW COMPARISON:  None. FINDINGS: Mild tricompartmental osteoarthritis with peripheral spurring. No acute fracture or subluxation. Fragmentation of the anterior tibial tubercle. No bony destructive change. There is a large knee joint effusion. IMPRESSION: Large knee joint effusion with mild tricompartmental osteoarthritis. No acute bony abnormality. Electronically Signed   By: Rubye OaksMelanie  Ehinger M.D.   On: 12/26/2015 22:29   Dg Knee Complete 4 Views Right  Result Date: 12/26/2015 CLINICAL DATA:  History of arthritis, chronic knee pain EXAM: RIGHT KNEE - COMPLETE 4+ VIEW COMPARISON:  None. FINDINGS: There is mild narrowing and spurring of the medial compartment. No acute fracture or  dislocation. Large suprapatellar joint effusion. IMPRESSION: 1. No acute fracture. 2. Large suprapatellar joint effusion. Electronically Signed   By: Jasmine PangKim  Fujinaga M.D.   On: 12/26/2015 22:28    EKG:   Assessment/Plan Principal Problem:   Cellulitis Active Problems:   Hypertension   High cholesterol   Cocaine abuse   Chronic knee pain   Anxiety   Acute kidney injury (HCC)   #1. Lateral knee pain concerning for septic arthritis versus cellulitis. X-ray of the right knee no acute fracture large joint effusion, x-ray of left knee large knee effusion and osteoarthritis WBCs 13. She is afebrile hemodynamically stable. Elevated sedimentation rate and C-reactive protein. She received Solu-Medrol and antibiotics and analgesia  In emergency department -Admit to telemetry -Continue antibiotics for now -Supportive therapy -Pain management. Hold sedating meds for now as patient is quite lethargic -Defer further steroids to orthopedics -Right orthopedics recommendations  2. Hypertension. Poor control. Home medications include Norvasc and hydrochlorothiazide. I suspect some noncompliance. Blood pressure improved after Norvasc and hydralazine -Provide Norvasc -When necessary hydralazine -Monitor closely  3. Acute kidney injury. I suspect there is some chronic component secondary to uncontrolled blood pressure. Creatinine 1.9 on admission. -Hold nephrotoxins -Monitor urine output -Gentle IV fluids -Recheck in the morning  #4. History of cocaine abuse. Urine drug screen positive for cocaine and benzos -Social work consult  #5. Leukocytosis. Likely related to #1. chest x-ray with no infiltrate. Urinalysis unremarkable. -Antibiotics as noted above -Monitor    DVT prophylaxis: lovenox Code Status: full  Family Communication: none present  Disposition Plan: home  Consults called: [facility]  Admission status: inpatient    Gwenyth BenderBLACK,Corwin Kuiken M MD [facility]  If 7PM-7AM,  please contact night-coverage [email or web address] Password [email or web address]  12/27/2015, 8:00 AM

## 2015-12-27 NOTE — Progress Notes (Signed)
Pharmacy Antibiotic Note  Amy Gibbs is a 50 y.o. female admitted on 12/26/2015 with possible septic arthritis.  Pharmacy has been consulted for Vancomycin/Cefepime dosing. Pt with rapidly worsening bilateral knee pain (R>L). WBC is mildly elevated. Pt is in acute renal failure with CrCl ~35 mL/min. X-rays of bilateral knees reveal large effusions on both sides. Pt is afebrile and hypertensive.  Plan: -Vancomycin 1000 mg IV q24h -Cefepime 2g IV q24h -Will need dose increases when renal function improves -Trend WBC, temp -Drug levels at steady state -F/U ortho consult  Weight: 145 lb (65.8 kg)  Temp (24hrs), Avg:98.2 F (36.8 C), Min:98.2 F (36.8 C), Max:98.2 F (36.8 C)   Recent Labs Lab 12/26/15 2050  WBC 13.0*  CREATININE 1.98*     Abran DukeLedford, Bryn Perkin 12/27/2015 12:54 AM

## 2015-12-27 NOTE — ED Notes (Signed)
Pt remains very drowsy and falls asleep before eating soup provided.

## 2015-12-27 NOTE — Progress Notes (Signed)
Patient ID: Amy Gibbs, female   DOB: 02/13/1966, 50 y.o.   MRN: [id number]    Please call the floor manager at extension [phone], as soon as the patient arrives to the medical unit, for admitting physician assignment.  50 year old female with a past medical history osteoarthritis, chronic back pain, chronic knee pain, hyperlipidemia, hypertension who [facility] to bilateral [facility] swelling. Workup shows elevated WBC, creatinine, CRP and sedimentation rate. See below:  She is currently being treated for cellulitis and possible septic arthritis. Dr. Renaye Rakersim Murphy will be evaluating the patient later today.  C-reactive protein [[id number]][[id number]] (Abnormal) Collected: 12/26/15 2050  Updated: 12/26/15 2342   Specimen Type: Blood   Specimen Source: Vein    CRP 10.4 (H) mg/dL  Sedimentation rate [[id number]][[id number]] (Abnormal) Collected: 12/26/15 2050  Updated: 12/26/15 2211   Specimen Type: Blood   Specimen Source: Vein    Sed Rate 82 (H) mm/hr  CBC with Differential [[id number]][[id number]] (Abnormal) Collected: 12/26/15 2050  Updated: 12/26/15 2139   Specimen Type: Blood   Specimen Source: Vein    WBC 13.0 (H) K/uL   RBC 3.97 MIL/uL   Hemoglobin 12.2 g/dL   HCT 66.036.9 %   MCV 63.092.9 fL   MCH 30.7 pg   MCHC 33.1 g/dL   RDW 16.014.7 %   Platelets 331 K/uL   Neutrophils Relative % 65 %   Lymphocytes Relative 25 %   Monocytes Relative 9 %   Eosinophils Relative 1 %   Basophils Relative 0 %   Neutro Abs 8.4 (H) K/uL   Lymphs Abs 3.3 K/uL   Monocytes Absolute 1.2 (H) K/uL   Eosinophils Absolute 0.1 K/uL   Basophils Absolute 0.0 K/uL  Basic metabolic panel [[id number]][[id number]] (Abnormal) Collected: 12/26/15 2050  Updated: 12/26/15 2124   Specimen Type: Blood   Specimen Source: Vein    Sodium 138 mmol/L   Potassium 3.6 mmol/L   Chloride 105 mmol/L   CO2 25 mmol/L   Glucose, Bld 129 (H) mg/dL   BUN 24 (H) mg/dL   Creatinine, Ser 3.221.98 (H) mg/dL   Calcium 9.3 mg/dL   GFR calc non Af Amer 28 (L)  mL/min   GFR calc Af Amer 33 (L) mL/min   Anion gap 8   Sanda Kleinavid Ortiz, M.D.  [phone].

## 2015-12-27 NOTE — Progress Notes (Signed)
Attempted to assist MD and PA with aspiration of patient's bilateral knees.  Patient stated that she did not like needles and would not allow the MD to remove and fluid.  MD explained procedure and it's importance thoroughly.  Patient declined procedure multiple times.  Peri MarisAndrew Kimbely Whiteaker, MBA, BSN, RN

## 2015-12-27 NOTE — ED Notes (Signed)
Pt wanting more pain medication, but due to pt being so drowsy, pain medication held at this time.

## 2015-12-27 NOTE — Progress Notes (Signed)
Pt arrived to the unit at 0405. MD on call was paged but he called back to report that he doesn't know if he/or day shift MD will be doing the admission. The on-call MD has addressed some issues such as medication orders. Pt is resting at this time with eyes closed, in no acute distress.

## 2015-12-28 ENCOUNTER — Encounter (HOSPITAL_COMMUNITY)
Admission: EM | Disposition: A | Discharge status: Home / Self Care | Payer: Self-pay | Source: Home / Self Care | Attending: Internal Medicine

## 2015-12-28 ENCOUNTER — Encounter (HOSPITAL_COMMUNITY): Payer: Self-pay | Admitting: Anesthesiology

## 2015-12-28 DIAGNOSIS — F419 Anxiety disorder, unspecified: Secondary | ICD-10-CM

## 2015-12-28 DIAGNOSIS — L03119 Cellulitis of unspecified part of limb: Secondary | ICD-10-CM

## 2015-12-28 DIAGNOSIS — A419 Sepsis, unspecified organism: Secondary | ICD-10-CM | POA: Diagnosis present

## 2015-12-28 LAB — BASIC METABOLIC PANEL
ANION GAP: 8 (ref 5–15)
BUN: 20 mg/dL (ref 6–20)
CALCIUM: 9 mg/dL (ref 8.9–10.3)
CHLORIDE: 109 mmol/L (ref 101–111)
CO2: 24 mmol/L (ref 22–32)
Creatinine, Ser: 1.69 mg/dL — ABNORMAL HIGH (ref 0.44–1.00)
GFR calc non Af Amer: 34 mL/min — ABNORMAL LOW (ref >60)
GFR, EST AFRICAN AMERICAN: 40 mL/min — AB (ref >60)
Glucose, Bld: 117 mg/dL — ABNORMAL HIGH (ref 65–99)
Potassium: 3.7 mmol/L (ref 3.5–5.1)
Sodium: 141 mmol/L (ref 135–145)

## 2015-12-28 LAB — CBC
HCT: 33.7 % — ABNORMAL LOW (ref 36.0–46.0)
Hemoglobin: 11 g/dL — ABNORMAL LOW (ref 12.0–15.0)
MCH: 30.6 pg (ref 26.0–34.0)
MCHC: 32.6 g/dL (ref 30.0–36.0)
MCV: 93.9 fL (ref 78.0–100.0)
PLATELETS: 317 10*3/uL (ref 150–400)
RBC: 3.59 MIL/uL — AB (ref 3.87–5.11)
RDW: 15.4 % (ref 11.5–15.5)
WBC: 11.2 10*3/uL — ABNORMAL HIGH (ref 4.0–10.5)

## 2015-12-28 SURGERY — ARTHROSCOPY, KNEE
Anesthesia: General | Laterality: Bilateral

## 2015-12-28 MED ORDER — AMLODIPINE BESYLATE 5 MG PO TABS
5.0000 mg | ORAL_TABLET | Freq: Every day | ORAL | Status: DC
  Administered 2015-12-28 – 2015-12-31 (×4): 5 mg via ORAL
  Filled 2015-12-28 (×4): qty 1

## 2015-12-28 MED ORDER — SODIUM CHLORIDE 0.9 % IV SOLN
INTRAVENOUS | Status: DC
  Administered 2015-12-28 – 2015-12-31 (×6): via INTRAVENOUS

## 2015-12-28 MED ORDER — FENTANYL CITRATE (PF) 100 MCG/2ML IJ SOLN
INTRAMUSCULAR | Status: AC
  Filled 2015-12-28: qty 4

## 2015-12-28 MED ORDER — HYDROCHLOROTHIAZIDE 25 MG PO TABS
25.0000 mg | ORAL_TABLET | Freq: Every day | ORAL | Status: DC
  Administered 2015-12-28 – 2015-12-30 (×3): 25 mg via ORAL
  Filled 2015-12-28 (×3): qty 1

## 2015-12-28 MED ORDER — PROPOFOL 10 MG/ML IV BOLUS
INTRAVENOUS | Status: AC
  Filled 2015-12-28: qty 40

## 2015-12-28 MED ORDER — MIDAZOLAM HCL 2 MG/2ML IJ SOLN
INTRAMUSCULAR | Status: AC
  Filled 2015-12-28: qty 2

## 2015-12-28 NOTE — Anesthesia Preprocedure Evaluation (Signed)
Anesthesia Evaluation  Patient identified by MRN, date of birth, ID band Patient awake    Reviewed: Allergy & Precautions, NPO status , Patient's Chart, lab work & pertinent test results  Airway        Dental   Pulmonary Current Smoker,           Cardiovascular hypertension, Pt. on medications      Neuro/Psych PSYCHIATRIC DISORDERS Anxiety negative neurological ROS     GI/Hepatic negative GI ROS, (+)     substance abuse  cocaine use,   Endo/Other  negative endocrine ROS  Renal/GU Renal InsufficiencyRenal disease     Musculoskeletal  (+) Arthritis , Osteoarthritis,    Abdominal   Peds  Hematology negative hematology ROS (+)   Anesthesia Other Findings Day of surgery medications reviewed with the patient.  Reproductive/Obstetrics                             Anesthesia Physical Anesthesia Plan  ASA: II  Anesthesia Plan: General   Post-op Pain Management:    Induction: Intravenous  Airway Management Planned: LMA  Additional Equipment:   Intra-op Plan:   Post-operative Plan: Extubation in OR  Informed Consent: I have reviewed the patients History and Physical, chart, labs and discussed the procedure including the risks, benefits and alternatives for the proposed anesthesia with the patient or authorized representative who has indicated his/her understanding and acceptance.   Dental advisory given  Plan Discussed with: CRNA  Anesthesia Plan Comments: (Risks/benefits of general anesthesia discussed with patient including risk of damage to teeth, lips, gum, and tongue, nausea/vomiting, allergic reactions to medications, and the possibility of heart attack, stroke and death.  All patient questions answered.  Patient wishes to proceed.)        Anesthesia Quick Evaluation

## 2015-12-28 NOTE — Progress Notes (Signed)
PROGRESS NOTE    [name]  ZOX:[id number] DOB: August 20, 1965 DOA: 12/26/2015 PCP: No PCP Per Patient     Brief Narrative:  Amy Gibbs is a 50 y.o. female with medical history significant for hypertension, cocaine abuse, chronic back pain, anxiety presents from [facility] chief complaint of bilateral knee pain and swelling. Initial workup reveals acute renal failure, bilateral effusions at knees, mild leukocytosis concern for septic arthritis versus cellulitis. Bilateral knees were aspirated which was consistent with inflammatory findings rather than septic joint.    Assessment & Plan:   Principal Problem:   Cellulitis Active Problems:   Hypertension   High cholesterol   Cocaine abuse   Chronic knee pain   Anxiety   Acute kidney injury (HCC)   Leukocytosis  Sepsis secondary to cellulitis  -Leukocytosis, HR > 90, RR 20 -While it is less likely that she has bilateral knee cellulitis, this is the only known source of possible septic process. Urinalysis, as well as chest x-ray and knee aspiration are all unremarkable. Patient has no other complaints beside knee pain and swelling.  -Continue vancomycin for now. Stop cefepime.   Bilateral knee effusions and pain -X-ray of bilateral knee with large joint effusion -Appreciate orthopedic surgery -Await final aspirate culture, more consistent with inflammatory process rather than infectious -PT/OT   AKI -Urinalysis unremarkable, renal ultrasound unremarkable -Improving with IV fluids, possibly due to dehydration as a cause   Essential hypertension -Uncontrolled. Noncompliance with previously prescribed Norvasc, hydrochlorothiazide. Restart these medications -Monitor blood pressure  History of cocaine abuse -UDS positive for cocaine and benzo   DVT prophylaxis: lovenox Code Status: full Family Communication: no family at bedside Disposition Plan: pending further improvement and PT/OT eval   Consultants:     Orthopedic sx  Procedures:   None  Antimicrobials:  Anti-infectives    Start     Dose/Rate Route Frequency Ordered Stop   12/27/15 2200  vancomycin (VANCOCIN) IVPB 1000 mg/200 mL premix     1,000 mg 200 mL/hr over 60 Minutes Intravenous Every 24 hours 12/27/15 0101     12/27/15 2200  ceFEPIme (MAXIPIME) 2 g in dextrose 5 % 50 mL IVPB  Status:  Discontinued     2 g 100 mL/hr over 30 Minutes Intravenous Every 24 hours 12/27/15 0101 12/28/15 1331   12/27/15 0016  ceFEPIme (MAXIPIME) 2 g injection    Comments:  Waldo Laine   : cabinet override      12/27/15 0016 12/27/15 1229   12/27/15 0015  ceFEPIme (MAXIPIME) 2 g in dextrose 5 % 50 mL IVPB     2 g 100 mL/hr over 30 Minutes Intravenous  Once 12/27/15 0011 12/27/15 0048   12/27/15 0015  vancomycin (VANCOCIN) IVPB 1000 mg/200 mL premix     1,000 mg 200 mL/hr over 60 Minutes Intravenous  Once 12/27/15 0011 12/27/15 0152   12/27/15 0000  vancomycin (VANCOCIN) IVPB 1000 mg/200 mL premix  Status:  Discontinued     1,000 mg 200 mL/hr over 60 Minutes Intravenous  Once 12/26/15 2348 12/26/15 2349   12/27/15 0000  ceFEPIme (MAXIPIME) 2 g in dextrose 5 % 50 mL IVPB  Status:  Discontinued     2 g 100 mL/hr over 30 Minutes Intravenous Every 8 hours 12/26/15 2348 12/26/15 2349       Subjective: Complaining of bilateral knee pain and swelling, worse on the right compared to left. She states that she can hardly walk due to pain. 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 No traumas reported. No other skin findings. She has no other complaints, denies any fevers, chills, chest pain, shortness of breath, cough, nausea, vomiting, diarrhea, abdominal pain. No dysuria.  Objective: Vitals:   12/27/15 1604 12/27/15 2033 12/28/15 0509 12/28/15 0932  BP: 134/86 139/88 136/73 131/87  Pulse: 100 82 80 93  Resp: 18 19 20 20   Temp: 98 F (36.7 C) 98.4 F (36.9 C) 98.3 F (36.8 C) 97.3 F (36.3 C)  TempSrc: Axillary Oral Oral Oral   SpO2: 97% 100% 100% 100%  Weight:  72.5 kg (159 lb 13.3 oz)    Height:        Intake/Output Summary (Last 24 hours) at 12/28/15 1341 Last data filed at 12/28/15 0600  Gross per 24 hour  Intake           931.25 ml  Output                0 ml  Net           931.25 ml   Filed Weights   12/26/15 1859 12/27/15 0353 12/27/15 2033  Weight: 65.8 kg (145 lb) 72.1 kg (158 lb 14.4 oz) 72.5 kg (159 lb 13.3 oz)    Examination:  General exam: Appears calm and comfortable  Respiratory system: Clear to auscultation. Respiratory effort normal. Cardiovascular system: S1 & S2 heard, RRR. No JVD, murmurs, rubs, gallops or clicks. No pedal edema. Gastrointestinal system: Abdomen is nondistended, soft and nontender. No organomegaly or masses felt. Normal bowel sounds heard. Central nervous system: Alert and oriented. No focal neurological deficits. Extremities/Skin: Symmetric 5 x 5 power. Bilateral knees with erythema, swelling, tender to palpation, and warmth  Psychiatry: Judgement and insight appear normal. Mood & affect appropriate.   Data Reviewed: I have personally reviewed following labs and imaging studies  CBC:  Recent Labs Lab 12/26/15 2050 12/28/15 0602  WBC 13.0* 11.2*  NEUTROABS 8.4*  --   HGB 12.2 11.0*  HCT 36.9 33.7*  MCV 92.9 93.9  PLT 331 317   Basic Metabolic Panel:  Recent Labs Lab 12/26/15 2050 12/28/15 0602  NA 138 141  K 3.6 3.7  CL 105 109  CO2 25 24  GLUCOSE 129* 117*  BUN 24* 20  CREATININE 1.98* 1.69*  CALCIUM 9.3 9.0   GFR: Estimated Creatinine Clearance: 39.2 mL/min (by C-G formula based on SCr of 1.69 mg/dL (H)). Liver Function Tests: No results for input(s): AST, ALT, ALKPHOS, BILITOT, PROT, ALBUMIN in the last 168 hours. No results for input(s): LIPASE, AMYLASE in the last 168 hours. No results for input(s): AMMONIA in the last 168 hours. Coagulation Profile: No results for input(s): INR, PROTIME in the last 168 hours. Cardiac Enzymes: No  results for input(s): CKTOTAL, CKMB, CKMBINDEX, TROPONINI in the last 168 hours. BNP (last 3 results) No results for input(s): PROBNP in the last 8760 hours. HbA1C: No results for input(s): HGBA1C in the last 72 hours. CBG: No results for input(s): GLUCAP in the last 168 hours. Lipid Profile: No results for input(s): CHOL, HDL, LDLCALC, TRIG, CHOLHDL, LDLDIRECT in the last 72 hours. Thyroid Function Tests: No results for input(s): TSH, T4TOTAL, FREET4, T3FREE, THYROIDAB in the last 72 hours. Anemia Panel: No results for input(s): VITAMINB12, FOLATE, FERRITIN, TIBC, IRON, RETICCTPCT in the last 72 hours. Sepsis Labs: No results for input(s): PROCALCITON, LATICACIDVEN in the last 168 hours.  Recent Results (from the past 240 hour(s))  Culture, body fluid-bottle     Status:  None (Preliminary result)   Collection Time: 12/27/15  4:00 PM  Result Value Ref Range Status   Specimen Description SYNOVIAL LEFT KNEE  Final   Special Requests BOTTLES DRAWN AEROBIC AND ANAEROBIC 10CC  Final   Culture NO GROWTH < 24 HOURS  Final   Report Status PENDING  Incomplete  Gram stain     Status: None   Collection Time: 12/27/15  4:00 PM  Result Value Ref Range Status   Specimen Description SYNOVIAL LEFT KNEE  Final   Special Requests NONE  Final   Gram Stain   Final    ABUNDANT WBC PRESENT, PREDOMINANTLY PMN NO ORGANISMS SEEN    Report Status 12/27/2015 FINAL  Final  Culture, body fluid-bottle     Status: None (Preliminary result)   Collection Time: 12/27/15  4:00 PM  Result Value Ref Range Status   Specimen Description SYNOVIAL RIGHT KNEE  Final   Special Requests BOTTLES DRAWN AEROBIC AND ANAEROBIC 10CC  Final   Culture NO GROWTH < 24 HOURS  Final   Report Status PENDING  Incomplete  Gram stain     Status: None   Collection Time: 12/27/15  4:00 PM  Result Value Ref Range Status   Specimen Description SYNOVIAL RIGHT KNEE  Final   Special Requests NONE  Final   Gram Stain   Final    ABUNDANT  WBC PRESENT, PREDOMINANTLY PMN NO ORGANISMS SEEN    Report Status 12/27/2015 FINAL  Final       Radiology Studies: Dg Chest 2 View  Result Date: 12/27/2015 CLINICAL DATA:  Wheezing.  Chest pain and cough. EXAM: CHEST  2 VIEW COMPARISON:  None. FINDINGS: Lung volumes are low. Prominence of the cardiac silhouette likely accentuated by low lung volumes. There is tortuosity of the thoracic aorta. No focal airspace disease, pulmonary edema or pleural fluid. No pneumothorax. No acute osseous abnormality is seen. IMPRESSION: Hypoventilatory chest. Prominent cardiac silhouette is likely accentuated by low lung volumes. Electronically Signed   By: Rubye Oaks M.D.   On: 12/27/2015 00:17   US Renal  Result Date: 12/27/2015 CLINICAL DATA:  Acute renal disease. EXAM: RENAL / URINARY TRACT ULTRASOUND COMPLETE COMPARISON:  None. FINDINGS: Right Kidney: Length: 9.3 cm. Echogenicity within normal limits. No mass or hydronephrosis visualized. Left Kidney: Length: 9.8 cm. Echogenicity within normal limits. No mass or hydronephrosis visualized. Bladder: Appears normal for degree of bladder distention. IMPRESSION: No cause for acute renal insufficiency identified. Electronically Signed   By: Gerome Sam III M.D   On: 12/27/2015 19:01   Dg Knee Complete 4 Views Left  Result Date: 12/26/2015 CLINICAL DATA:  Left knee pain and swelling.  Acute on chronic pain. EXAM: LEFT KNEE - COMPLETE 4+ VIEW COMPARISON:  None. FINDINGS: Mild tricompartmental osteoarthritis with peripheral spurring. No acute fracture or subluxation. Fragmentation of the anterior tibial tubercle. No bony destructive change. There is a large knee joint effusion. IMPRESSION: Large knee joint effusion with mild tricompartmental osteoarthritis. No acute bony abnormality. Electronically Signed   By: Rubye Oaks M.D.   On: 12/26/2015 22:29   Dg Knee Complete 4 Views Right  Result Date: 12/26/2015 CLINICAL DATA:  History of arthritis,  chronic knee pain EXAM: RIGHT KNEE - COMPLETE 4+ VIEW COMPARISON:  None. FINDINGS: There is mild narrowing and spurring of the medial compartment. No acute fracture or dislocation. Large suprapatellar joint effusion. IMPRESSION: 1. No acute fracture. 2. Large suprapatellar joint effusion. Electronically Signed   By: Adrian Prows.D.  On: 12/26/2015 22:28      Scheduled Meds: . amLODipine  5 mg Oral Daily  . enoxaparin (LOVENOX) injection  40 mg Subcutaneous Q24H  . hydrochlorothiazide  25 mg Oral Daily  . vancomycin  1,000 mg Intravenous Q24H   Continuous Infusions: . sodium chloride       LOS: 1 day    Time spent: 40 minutes   Noralee Stain, DO [facility] [email or web address] Password [email or web address] 12/28/2015, 1:41 PM

## 2015-12-28 NOTE — Progress Notes (Signed)
Orthopaedic Weekend Coverage  Analysis of synovial fluid reviewed Both R and L knee are consistent with inflammatory arthropathy, not infectious   Results for Amy Gibbs, Amy Gibbs (MRN [id number]) as of 12/28/2015 12:54  Ref. Range 12/27/2015 16:00 12/27/2015 16:00  Color, Synovial Latest Ref Range: YELLOW  YELLOW YELLOW  Appearance-Synovial Latest Ref Range: CLEAR  TURBID (A) TURBID (A)  Crystals, Fluid Unknown NO CRYSTALS SEEN NO CRYSTALS SEEN  WBC, Synovial Latest Ref Range: 0 - 200 /cu mm 6,800 (H) 10,700 (H)  Neutrophil, Synovial Latest Ref Range: 0 - 25 % 96 (H) 94 (H)  Lymphocytes-Synovial Fld Latest Ref Range: 0 - 20 % 2 4  Monocyte-Macrophage-Synovial Fluid Latest Ref Range: 50 - 90 % 2 (L) 2 (L)  Eosinophils-Synovial Latest Ref Range: 0 - 1 % 0 0    Continue with activities as tolerated WBAT B LEX ROM as tolerated  Continue per medicine service  Do not believe pts knees to be cause of elevated CRP and ESR at this time  Will re-eval tomorrow as well  Reg diet  Ice prn knees  Defer to primary service regarding continuation of ABX but no clear indication at this time as it pertains to her knees Will follow up on synovial fluid cultures once they are finalized   Jari Pigg, PA-C [facility] [phone] (P)

## 2015-12-28 NOTE — Progress Notes (Signed)
Patient to go to OR this morning. Patient requested to speak to Dr. Carola FrostHandy before proceeding. Paged for Dr. Carola FrostHandy.  [name],[name], RN

## 2015-12-29 ENCOUNTER — Encounter (HOSPITAL_COMMUNITY): Payer: Self-pay | Admitting: Orthopedic Surgery

## 2015-12-29 DIAGNOSIS — G8929 Other chronic pain: Secondary | ICD-10-CM

## 2015-12-29 DIAGNOSIS — M25562 Pain in left knee: Secondary | ICD-10-CM

## 2015-12-29 DIAGNOSIS — M25561 Pain in right knee: Secondary | ICD-10-CM

## 2015-12-29 DIAGNOSIS — A599 Trichomoniasis, unspecified: Secondary | ICD-10-CM

## 2015-12-29 HISTORY — DX: Trichomoniasis, unspecified: A59.9

## 2015-12-29 LAB — CBC WITH DIFFERENTIAL/PLATELET
BASOS ABS: 0 10*3/uL (ref 0.0–0.1)
BASOS PCT: 0 %
EOS PCT: 2 %
Eosinophils Absolute: 0.2 10*3/uL (ref 0.0–0.7)
HCT: 33.1 % — ABNORMAL LOW (ref 36.0–46.0)
Hemoglobin: 10.6 g/dL — ABNORMAL LOW (ref 12.0–15.0)
Lymphocytes Relative: 42 %
Lymphs Abs: 4 10*3/uL (ref 0.7–4.0)
MCH: 30.1 pg (ref 26.0–34.0)
MCHC: 32 g/dL (ref 30.0–36.0)
MCV: 94 fL (ref 78.0–100.0)
MONO ABS: 0.8 10*3/uL (ref 0.1–1.0)
MONOS PCT: 8 %
Neutro Abs: 4.7 10*3/uL (ref 1.7–7.7)
Neutrophils Relative %: 48 %
Platelets: 324 10*3/uL (ref 150–400)
RBC: 3.52 MIL/uL — ABNORMAL LOW (ref 3.87–5.11)
RDW: 15.4 % (ref 11.5–15.5)
WBC: 9.7 10*3/uL (ref 4.0–10.5)

## 2015-12-29 LAB — BASIC METABOLIC PANEL
Anion gap: 7 (ref 5–15)
BUN: 23 mg/dL — AB (ref 6–20)
CHLORIDE: 110 mmol/L (ref 101–111)
CO2: 23 mmol/L (ref 22–32)
Calcium: 8.6 mg/dL — ABNORMAL LOW (ref 8.9–10.3)
Creatinine, Ser: 1.64 mg/dL — ABNORMAL HIGH (ref 0.44–1.00)
GFR calc Af Amer: 41 mL/min — ABNORMAL LOW (ref >60)
GFR calc non Af Amer: 36 mL/min — ABNORMAL LOW (ref >60)
Glucose, Bld: 139 mg/dL — ABNORMAL HIGH (ref 65–99)
POTASSIUM: 3.6 mmol/L (ref 3.5–5.1)
SODIUM: 140 mmol/L (ref 135–145)

## 2015-12-29 LAB — RAPID HIV SCREEN (HIV 1/2 AB+AG)
HIV 1/2 ANTIBODIES: NONREACTIVE
HIV-1 P24 ANTIGEN - HIV24: NONREACTIVE

## 2015-12-29 MED ORDER — HYDROCODONE-ACETAMINOPHEN 5-325 MG PO TABS
1.0000 | ORAL_TABLET | ORAL | Status: DC | PRN
  Administered 2015-12-29: 1 via ORAL
  Administered 2015-12-30: 2 via ORAL
  Filled 2015-12-29 (×2): qty 2

## 2015-12-29 MED ORDER — DOCUSATE SODIUM 100 MG PO CAPS
100.0000 mg | ORAL_CAPSULE | Freq: Every day | ORAL | Status: DC
  Administered 2015-12-30 – 2015-12-31 (×2): 100 mg via ORAL
  Filled 2015-12-29 (×2): qty 1

## 2015-12-29 MED ORDER — KETOROLAC TROMETHAMINE 30 MG/ML IJ SOLN
30.0000 mg | Freq: Once | INTRAMUSCULAR | Status: AC
Start: 2015-12-29 — End: 2015-12-29
  Administered 2015-12-29: 30 mg via INTRAVENOUS
  Filled 2015-12-29: qty 1

## 2015-12-29 MED ORDER — HYDROCODONE-ACETAMINOPHEN 5-325 MG PO TABS
1.0000 | ORAL_TABLET | Freq: Three times a day (TID) | ORAL | Status: DC | PRN
  Administered 2015-12-29: 1 via ORAL
  Filled 2015-12-29: qty 2

## 2015-12-29 MED ORDER — POLYETHYLENE GLYCOL 3350 17 G PO PACK
17.0000 g | PACK | Freq: Every day | ORAL | Status: DC
  Administered 2015-12-30 – 2016-01-02 (×4): 17 g via ORAL
  Filled 2015-12-29 (×4): qty 1

## 2015-12-29 MED ORDER — METRONIDAZOLE 500 MG PO TABS
500.0000 mg | ORAL_TABLET | Freq: Two times a day (BID) | ORAL | Status: DC
  Administered 2015-12-29 – 2016-01-02 (×9): 500 mg via ORAL
  Filled 2015-12-29 (×10): qty 1

## 2015-12-29 MED ORDER — PREDNISONE 20 MG PO TABS
40.0000 mg | ORAL_TABLET | Freq: Every day | ORAL | Status: DC
  Administered 2015-12-30: 40 mg via ORAL
  Filled 2015-12-29: qty 2

## 2015-12-29 MED ORDER — KETOROLAC TROMETHAMINE 30 MG/ML IJ SOLN
30.0000 mg | Freq: Four times a day (QID) | INTRAMUSCULAR | Status: DC | PRN
  Administered 2015-12-29 – 2015-12-30 (×3): 30 mg via INTRAVENOUS
  Filled 2015-12-29 (×3): qty 1

## 2015-12-29 NOTE — Care Management Note (Signed)
Case Management Note  Patient Details  Name: Rockey Situonya Bryk MRN: [id number] Date of Birth: 12/08/1965  Subjective/Objective:            C/o moderate B knee pain        Action/Plan: CM spoke with patient concerning CM consult for  HHPT, unfortunately patient does not have health insurance and is not eligible for HHPT services. CM discussed this with patient but explained she may be eligible for DME rolling walker and 3n1 chair under [facility].  This CM will handoff to Unit CM to follow up with care transitional plan.  Expected Discharge Date:  12/29/15              Expected Discharge Plan:  Home/Self Care  In-House Referral:     Discharge planning Services  CM Consult  Post Acute Care Choice:    Choice offered to:  Patient  DME Arranged:  3-N-1, Walker rolling DME Agency:  [facility]  [facility]     Status of Service:  In process, will continue to follow  If discussed at Long Length of Stay Meetings, dates discussed:    Additional CommentsMichel Bickers:  Tieler Cournoyer, RN 12/29/2015, 7:22 PM

## 2015-12-29 NOTE — Progress Notes (Addendum)
PROGRESS NOTE    Amy Gibbs  ZOX:[id number] DOB: 1965-05-23 DOA: 12/26/2015 PCP: No PCP Per Patient     Brief Narrative:  Amy Gibbs is a 50 y.o. female with medical history significant for hypertension, cocaine abuse, chronic back pain, anxiety presents from [facility] chief complaint of bilateral knee pain and swelling. Initial workup reveals acute renal failure, bilateral effusions at knees, mild leukocytosis concern for septic arthritis versus cellulitis. Bilateral knees were aspirated which was consistent with inflammatory findings rather than septic joint.    Assessment & Plan:   Principal Problem:   Cellulitis Active Problems:   Hypertension   Cocaine abuse   Anxiety   Acute kidney injury (HCC)   Sepsis (HCC)   Trichomoniasis    Bilateral knee effusions and pain - overall improving swelling and pain  -No longer septic   -X-ray of bilateral knee with large joint effusion -Appreciate orthopedic surgery -Await final aspirate culture, more consistent with inflammatory process (reactive arthritis) rather than infectious. ?bursitis or cellulitis  -Prednisone, pain med  -PT/OT   AKI -Urinalysis unremarkable, renal ultrasound unremarkable -Improving with IV fluids, possibly due to dehydration as a cause   Essential hypertension -Uncontrolled. Noncompliance with previously prescribed Norvasc, hydrochlorothiazide. Restart these medications -Monitor blood pressure - improved   History of cocaine abuse -UDS positive for cocaine and benzo   DVT prophylaxis: lovenox Code Status: full Family Communication: no family at bedside Disposition Plan: pending further improvement   Consultants:   Orthopedic sx  Procedures:   None  Antimicrobials:  Anti-infectives    Start     Dose/Rate Route Frequency Ordered Stop   12/29/15 1330  metroNIDAZOLE (FLAGYL) tablet 500 mg     500 mg Oral Every 12 hours 12/29/15 1328 01/05/16 0959   12/27/15 2200   vancomycin (VANCOCIN) IVPB 1000 mg/200 mL premix     1,000 mg 200 mL/hr over 60 Minutes Intravenous Every 24 hours 12/27/15 0101     12/27/15 2200  ceFEPIme (MAXIPIME) 2 g in dextrose 5 % 50 mL IVPB  Status:  Discontinued     2 g 100 mL/hr over 30 Minutes Intravenous Every 24 hours 12/27/15 0101 12/28/15 1331   12/27/15 0016  ceFEPIme (MAXIPIME) 2 g injection    Comments:  Waldo Laine   : cabinet override      12/27/15 0016 12/27/15 1229   12/27/15 0015  ceFEPIme (MAXIPIME) 2 g in dextrose 5 % 50 mL IVPB     2 g 100 mL/hr over 30 Minutes Intravenous  Once 12/27/15 0011 12/27/15 0048   12/27/15 0015  vancomycin (VANCOCIN) IVPB 1000 mg/200 mL premix     1,000 mg 200 mL/hr over 60 Minutes Intravenous  Once 12/27/15 0011 12/27/15 0152   12/27/15 0000  vancomycin (VANCOCIN) IVPB 1000 mg/200 mL premix  Status:  Discontinued     1,000 mg 200 mL/hr over 60 Minutes Intravenous  Once 12/26/15 2348 12/26/15 2349   12/27/15 0000  ceFEPIme (MAXIPIME) 2 g in dextrose 5 % 50 mL IVPB  Status:  Discontinued     2 g 100 mL/hr over 30 Minutes Intravenous Every 8 hours 12/26/15 2348 12/26/15 2349       Subjective: Pain and swelling of knees improved with vancomycin. She worked with PT which went well. No acute complaints. She has a sore spot on the bottom of her left foot, which appear to be either a blister or cyst. Denies any fevers, chest pain, cough, shortness of breath, nausea, vomiting,  diarrhea, abdominal pain, dysuria. Patient is tolerating meals    Objective: Vitals:   12/28/15 0932 12/28/15 2155 12/29/15 0507 12/29/15 0851  BP: 131/87 127/81 128/84 130/90  Pulse: 93 81 76 79  Resp: 20 18 18 18   Temp: 97.3 F (36.3 C) 97.7 F (36.5 C) 97.7 F (36.5 C) 98.4 F (36.9 C)  TempSrc: Oral Oral Oral Oral  SpO2: 100% 100% 100% 100%  Weight:      Height:        Intake/Output Summary (Last 24 hours) at 12/29/15 1521 Last data filed at 12/29/15 0800  Gross per 24 hour  Intake           3728.33 ml  Output                2 ml  Net          3726.33 ml   Filed Weights   12/26/15 1859 12/27/15 0353 12/27/15 2033  Weight: 65.8 kg (145 lb) 72.1 kg (158 lb 14.4 oz) 72.5 kg (159 lb 13.3 oz)    Examination:  General exam: Appears calm and comfortable  Respiratory system: Clear to auscultation. Respiratory effort normal. Cardiovascular system: S1 & S2 heard, RRR. No JVD, murmurs, rubs, gallops or clicks. No pedal edema. Gastrointestinal system: Abdomen is nondistended, soft and nontender. No organomegaly or masses felt. Normal bowel sounds heard. Central nervous system: Alert and oriented. No focal neurological deficits. Extremities/Skin: Symmetric 5 x 5 power. Bilateral knees with erythema, swelling, tender to palpation, and warmth, right knee larger than left, left plantar surface of foot with about 2in diameter blister vs cyst - monitor  Psychiatry: Judgement and insight appear normal. Mood & affect appropriate.   Data Reviewed: I have personally reviewed following labs and imaging studies  CBC:  Recent Labs Lab 12/26/15 2050 12/28/15 0602 12/29/15 0521  WBC 13.0* 11.2* 9.7  NEUTROABS 8.4*  --  4.7  HGB 12.2 11.0* 10.6*  HCT 36.9 33.7* 33.1*  MCV 92.9 93.9 94.0  PLT 331 317 324   Basic Metabolic Panel:  Recent Labs Lab 12/26/15 2050 12/28/15 0602 12/29/15 0521  NA 138 141 140  K 3.6 3.7 3.6  CL 105 109 110  CO2 25 24 23   GLUCOSE 129* 117* 139*  BUN 24* 20 23*  CREATININE 1.98* 1.69* 1.64*  CALCIUM 9.3 9.0 8.6*   GFR: Estimated Creatinine Clearance: 40.4 mL/min (by C-G formula based on SCr of 1.64 mg/dL (H)). Liver Function Tests: No results for input(s): AST, ALT, ALKPHOS, BILITOT, PROT, ALBUMIN in the last 168 hours. No results for input(s): LIPASE, AMYLASE in the last 168 hours. No results for input(s): AMMONIA in the last 168 hours. Coagulation Profile: No results for input(s): INR, PROTIME in the last 168 hours. Cardiac Enzymes: No results for  input(s): CKTOTAL, CKMB, CKMBINDEX, TROPONINI in the last 168 hours. BNP (last 3 results) No results for input(s): PROBNP in the last 8760 hours. HbA1C: No results for input(s): HGBA1C in the last 72 hours. CBG: No results for input(s): GLUCAP in the last 168 hours. Lipid Profile: No results for input(s): CHOL, HDL, LDLCALC, TRIG, CHOLHDL, LDLDIRECT in the last 72 hours. Thyroid Function Tests: No results for input(s): TSH, T4TOTAL, FREET4, T3FREE, THYROIDAB in the last 72 hours. Anemia Panel: No results for input(s): VITAMINB12, FOLATE, FERRITIN, TIBC, IRON, RETICCTPCT in the last 72 hours. Sepsis Labs: No results for input(s): PROCALCITON, LATICACIDVEN in the last 168 hours.  Recent Results (from the past 240 hour(s))  Culture, body  fluid-bottle     Status: None (Preliminary result)   Collection Time: 12/27/15  4:00 PM  Result Value Ref Range Status   Specimen Description SYNOVIAL LEFT KNEE  Final   Special Requests BOTTLES DRAWN AEROBIC AND ANAEROBIC 10CC  Final   Culture NO GROWTH < 24 HOURS  Final   Report Status PENDING  Incomplete  Gram stain     Status: None   Collection Time: 12/27/15  4:00 PM  Result Value Ref Range Status   Specimen Description SYNOVIAL LEFT KNEE  Final   Special Requests NONE  Final   Gram Stain   Final    ABUNDANT WBC PRESENT, PREDOMINANTLY PMN NO ORGANISMS SEEN    Report Status 12/27/2015 FINAL  Final  Culture, body fluid-bottle     Status: None (Preliminary result)   Collection Time: 12/27/15  4:00 PM  Result Value Ref Range Status   Specimen Description SYNOVIAL RIGHT KNEE  Final   Special Requests BOTTLES DRAWN AEROBIC AND ANAEROBIC 10CC  Final   Culture NO GROWTH < 24 HOURS  Final   Report Status PENDING  Incomplete  Gram stain     Status: None   Collection Time: 12/27/15  4:00 PM  Result Value Ref Range Status   Specimen Description SYNOVIAL RIGHT KNEE  Final   Special Requests NONE  Final   Gram Stain   Final    ABUNDANT WBC PRESENT,  PREDOMINANTLY PMN NO ORGANISMS SEEN    Report Status 12/27/2015 FINAL  Final       Radiology Studies: US Renal  Result Date: 12/27/2015 CLINICAL DATA:  Acute renal disease. EXAM: RENAL / URINARY TRACT ULTRASOUND COMPLETE COMPARISON:  None. FINDINGS: Right Kidney: Length: 9.3 cm. Echogenicity within normal limits. No mass or hydronephrosis visualized. Left Kidney: Length: 9.8 cm. Echogenicity within normal limits. No mass or hydronephrosis visualized. Bladder: Appears normal for degree of bladder distention. IMPRESSION: No cause for acute renal insufficiency identified. Electronically Signed   By: Gerome Sam III M.D   On: 12/27/2015 19:01      Scheduled Meds: . amLODipine  5 mg Oral Daily  . docusate sodium  100 mg Oral Daily  . enoxaparin (LOVENOX) injection  40 mg Subcutaneous Q24H  . hydrochlorothiazide  25 mg Oral Daily  . metroNIDAZOLE  500 mg Oral Q12H  . polyethylene glycol  17 g Oral Daily  . predniSONE  40 mg Oral Q breakfast  . vancomycin  1,000 mg Intravenous Q24H   Continuous Infusions: . sodium chloride 100 mL/hr at 12/29/15 1457     LOS: 2 days    Time spent: 25 minutes   Noralee Stain, DO [facility] [email or web address] Password [email or web address] 12/29/2015, 3:21 PM

## 2015-12-29 NOTE — Progress Notes (Signed)
Orthopaedic Trauma Service Progress Note  Subjective  Pt seen and evaluated today Working with therapy and currently sitting in chair  C/o moderate B knee pain, it has improved somewhat since admission   Pt states that her joint pain is fairly constant and flares happen on a regular basis. Typically the pain is located in her ankles which also prohibits ambulation   Denies history of gout or pseudogout No history of rheumatologic conditions  No history of sickle cell   Admits to using cocaine and marijuana but not on the regular    Review of additional labs from admission show Trichomoniasis on u/a, ESR and CRP are elevated as well  No crystals on synovial fluid analysis  + bacteria seen on U/A micro  Elevated Cr and BUN  ROS B knee pain  No CP or SOB   Objective   BP 130/90 (BP Location: Left Arm)   Pulse 79   Temp 98.4 F (36.9 C) (Oral)   Resp 18   Ht _0  (1.702 m)   Wt 72.5 kg (159 lb 13.3 oz)   SpO2 100%   BMI 25.03 kg/m   Intake/Output      10/14 0701 - 10/15 0700 10/15 0701 - 10/16 0700   P.O. 0 2425   I.V. (mL/kg) 1203.3 (16.6)    Other 100    IV Piggyback     Total Intake(mL/kg) 1303.3 (18) 2425 (33.4)   Urine (mL/kg/hr) 0 (0) 2 (0)   Stool 0 (0) 0 (0)   Total Output 0 2   Net +1303.3 +2423        Urine Occurrence 0 x    Stool Occurrence 0 x      Labs  Results for Amy, Gibbs (MRN [id number]) as of 12/29/2015 13:08  Ref. Range 12/27/2015 16:00 12/27/2015 16:00  Color, Synovial Latest Ref Range: YELLOW  YELLOW YELLOW  Appearance-Synovial Latest Ref Range: CLEAR  TURBID (A) TURBID (A)  Crystals, Fluid Unknown NO CRYSTALS SEEN NO CRYSTALS SEEN  WBC, Synovial Latest Ref Range: 0 - 200 /cu mm 6,800 (H) 10,700 (H)  Neutrophil, Synovial Latest Ref Range: 0 - 25 % 96 (H) 94 (H)  Lymphocytes-Synovial Fld Latest Ref Range: 0 - 20 % 2 4  Monocyte-Macrophage-Synovial Fluid Latest Ref Range: 50 - 90 % 2 (L) 2 (L)  Eosinophils-Synovial Latest Ref  Range: 0 - 1 % 0 0   Results for Amy, Gibbs (MRN [id number]) as of 12/29/2015 13:08  Ref. Range 12/27/2015 01:30  Appearance Latest Ref Range: CLEAR  CLEAR  Bacteria, UA Latest Ref Range: NONE SEEN  FEW (A)  Bilirubin Urine Latest Ref Range: NEGATIVE  NEGATIVE  Casts Latest Ref Range: NEGATIVE  HYALINE CASTS (A)  Color, Urine Latest Ref Range: YELLOW  YELLOW  Glucose Latest Ref Range: NEGATIVE mg/dL 100 (A)  Hgb urine dipstick Latest Ref Range: NEGATIVE  NEGATIVE  Ketones, ur Latest Ref Range: NEGATIVE mg/dL NEGATIVE  Leukocytes, UA Latest Ref Range: NEGATIVE  NEGATIVE  Nitrite Latest Ref Range: NEGATIVE  NEGATIVE  pH Latest Ref Range: 5.0 - 8.0  6.5  Protein Latest Ref Range: NEGATIVE mg/dL 30 (A)  RBC / HPF Latest Ref Range: 0 - 5 RBC/hpf 0-5  Specific Gravity, Urine Latest Ref Range: 1.005 - 1.030  1.010  Squamous Epithelial / LPF Latest Ref Range: NONE SEEN  6-30 (A)  Urine-Other Unknown TRICHOMONAS PRESENT  WBC, UA Latest Ref Range: 0 - 5 WBC/hpf 0-5   Results for Amy, Gibbs (MRN [id number])  as of 12/29/2015 13:08  Ref. Range 12/26/2015 20:50  CRP Latest Ref Range: <1.0 mg/dL 10.4 (H)   Results for Amy, Gibbs (MRN [id number]) as of 12/29/2015 13:08  Ref. Range 12/26/2015 20:50  Sed Rate Latest Ref Range: 0 - 22 mm/hr 82 (H)   Exam  Gen: pt sitting in bedside chair, NAD: Ext:       Bilateral lower extremities   B knee effusion present   R>L  Pt does tolerates gentle ROM of knees  No motor or sensory disturbances distally   exts are warm  + DP Pulses bilaterally   Hyperkeratotic lesion on sole of Left foot (pt states this is chronic but does get tender at times)  No appreciable erythema of both knees     Assessment and Plan   POD/HD#: 2  - B knee effusions: suspicion for reactive arthritis vs other rheumatologic/inflammatory condition   Pain has improved since admission    Given clinical findings (chronic migratory polyarthralgia, skin lesion)  and historical information there is increased concern for reactive arthritis, particularly with u/a notable for trichomoniasis (and association with chlamydia infection). Not sure if elevated BUN/Cr directly related. Could reflect a post strep condition which could also cause reactive arthritis (would expect blood and protein on u/a)    Check following    Formal wet prep for dx of BV    GC and Chlamydia screens as these could also be causative in reactive arthritis    Will check additional arthropathy labs including RF, ANA, HLA B27, (reactive arthritis, RA, SLE)    Check HIV and hep C panel    Have started presumptive tx for Trichomoniasis: flagyl 500 mg po bid x 7 days    NSAIDs to be used with caution given Cr elevations   Ketorolac has been ordered for next 24 hours   Can try celebrex 7.5 mg po q12h after    Monitor renal function    Add Norco 5/325 for severe pain and to enable pt to work with therapy    This sounds to be a chronic condition for this patient so she may require DMARDs for long term control, will need outpt follow up with Rheumatology/PCP   Continue with therapies  Ice prn   Offered pt intra-articular steroid injection if synovial cultures are negative, doesn't seemed thrilled about additional injections     WBAT   ROM as tolerated B lower Extremities   - Dispo   Continue per medicine   Follow up on labs   Will need OP follow up with Rheumatology   Jari Pigg, PA-C [facility] [phone] (P) [phone] (O) 12/29/2015 1:28 PM

## 2015-12-29 NOTE — Evaluation (Signed)
Physical Therapy Evaluation Patient Details Name: Amy Gibbs MRN: [id number] DOB: 09-Aug-1965 Today's Date: 12/29/2015   History of Present Illness  Amy Gibbs is a 50 y.o. female with medical history significant for hypertension, cocaine abuse, chronic back pain, anxiety presents from [facility] chief complaint of bilateral knee pain and swelling. Initial workup reveals acute renal failure, bilateral effusions at knees, mild leukocytosis concern for septic arthritis versus cellulitis. Bilateral knees were aspirated which was consistent with inflammatory findings rather than septic joint.   Clinical Impression   Pt admitted with above diagnosis. Pt currently with functional limitations due to the deficits listed below (see PT Problem List).  Pt will benefit from skilled PT to increase their independence and safety with mobility to allow discharge to the venue listed below.       Follow Up Recommendations Home health PT;Supervision - Intermittent    Equipment Recommendations  Rolling walker with 5" wheels;3in1 (PT)    Recommendations for Other Services       Precautions / Restrictions Precautions Precautions: Fall Restrictions Weight Bearing Restrictions: No      Mobility  Bed Mobility Overal bed mobility: Needs Assistance Bed Mobility: Supine to Sit     Supine to sit: Supervision     General bed mobility comments: Used rails; moves slowly, but able to get up without physical assist  Transfers Overall transfer level: Needs assistance Equipment used: Rolling walker (2 wheeled) Transfers: Sit to/from Stand Sit to Stand: Min assist         General transfer comment: Min assist to power up; Heavy dependence on UEs; chose to push up on RW -- min assist to steady RW  Ambulation/Gait Ambulation/Gait assistance: Min assist Ambulation Distance (Feet):  (pivot steps bed to chair) Assistive device: Rolling walker (2 wheeled)       General Gait Details:  Heavy dependence on UE support  Stairs            Wheelchair Mobility    Modified Rankin (Stroke Patients Only)       Balance Overall balance assessment: Needs assistance           Standing balance-Leahy Scale: Poor                               Pertinent Vitals/Pain Pain Assessment: 0-10 Pain Score: 8  Pain Location: Bil knees Pain Descriptors / Indicators: Aching;Discomfort;Grimacing Pain Intervention(s): Limited activity within patient's tolerance;Monitored during session;Repositioned;Ice applied    Home Living Family/patient expects to be discharged to:: Private residence Living Arrangements: Non-relatives/Friends Available Help at Discharge: Family;Available PRN/intermittently Type of Home: House Home Access: Stairs to enter Entrance Stairs-Rails: None Entrance Stairs-Number of Steps: 2 Home Layout: One level        Prior Function Level of Independence: Independent               Hand Dominance        Extremity/Trunk Assessment   Upper Extremity Assessment: Overall WFL for tasks assessed           Lower Extremity Assessment: RLE deficits/detail;LLE deficits/detail RLE Deficits / Details: Grossly decr aROM and strength, limited by pain and effusion; Grimace with knee extension against gravity, 2/5; diffuclty getting knee fully straight with assist as well LLE Deficits / Details: Grossly decr aROM and strength, limited by pain and effusion; Grimace with knee extension against gravity, though able to go through greater range than L 3/5  Communication   Communication: Other (comment) (at times difficult to understand)  Cognition Arousal/Alertness: Awake/alert Behavior During Therapy: WFL for tasks assessed/performed Overall Cognitive Status: Within Functional Limits for tasks assessed (for simple mobility tasks)                      General Comments      Exercises     Assessment/Plan    PT Assessment  Patient needs continued PT services  PT Problem List Decreased strength;Decreased range of motion;Decreased activity tolerance;Decreased balance;Decreased mobility;Decreased knowledge of use of DME;Pain          PT Treatment Interventions DME instruction;Gait training;Stair training;Functional mobility training;Therapeutic activities;Therapeutic exercise;Balance training;Patient/family education    PT Goals (Current goals can be found in the Care Plan section)  Acute Rehab PT Goals Patient Stated Goal: less pain PT Goal Formulation: With patient Time For Goal Achievement: 01/12/16 Potential to Achieve Goals: Good    Frequency Min 3X/week   Barriers to discharge        Co-evaluation               End of Session Equipment Utilized During Treatment: Gait belt Activity Tolerance: Patient limited by pain Patient left: in chair;with call bell/phone within reach Nurse Communication: Mobility status         Time: [id number] PT Time Calculation (min) (ACUTE ONLY): 22 min   Charges:   PT Evaluation $PT Eval Moderate Complexity: 1 Procedure     PT G CodesOlen Pel:        Nour Rodrigues Hamff 12/29/2015, 3:34 PM  Van ClinesHolly Faelyn Sigler, PT  Acute Rehabilitation Services Pager [phone] Office [phone]

## 2015-12-30 DIAGNOSIS — A599 Trichomoniasis, unspecified: Secondary | ICD-10-CM

## 2015-12-30 DIAGNOSIS — M0230 Reiter's disease, unspecified site: Secondary | ICD-10-CM

## 2015-12-30 DIAGNOSIS — M25462 Effusion, left knee: Secondary | ICD-10-CM

## 2015-12-30 DIAGNOSIS — M25461 Effusion, right knee: Secondary | ICD-10-CM

## 2015-12-30 LAB — URINE CULTURE: CULTURE: NO GROWTH

## 2015-12-30 LAB — HEPATITIS PANEL, ACUTE
HCV Ab: <0.1 s/co ratio (ref 0.0–0.9)
HEP B S AG: NEGATIVE
Hep A IgM: NEGATIVE
Hep B C IgM: NEGATIVE

## 2015-12-30 LAB — CBC WITH DIFFERENTIAL/PLATELET
BASOS ABS: 0 10*3/uL (ref 0.0–0.1)
BASOS PCT: 0 %
EOS ABS: 0.2 10*3/uL (ref 0.0–0.7)
EOS PCT: 2 %
HCT: 33.7 % — ABNORMAL LOW (ref 36.0–46.0)
Hemoglobin: 10.8 g/dL — ABNORMAL LOW (ref 12.0–15.0)
Lymphocytes Relative: 39 %
Lymphs Abs: 2.9 10*3/uL (ref 0.7–4.0)
MCH: 30.1 pg (ref 26.0–34.0)
MCHC: 32 g/dL (ref 30.0–36.0)
MCV: 93.9 fL (ref 78.0–100.0)
MONO ABS: 0.7 10*3/uL (ref 0.1–1.0)
MONOS PCT: 9 %
Neutro Abs: 3.7 10*3/uL (ref 1.7–7.7)
Neutrophils Relative %: 50 %
PLATELETS: 350 10*3/uL (ref 150–400)
RBC: 3.59 MIL/uL — ABNORMAL LOW (ref 3.87–5.11)
RDW: 14.9 % (ref 11.5–15.5)
WBC: 7.5 10*3/uL (ref 4.0–10.5)

## 2015-12-30 LAB — BASIC METABOLIC PANEL
ANION GAP: 4 — AB (ref 5–15)
BUN: 16 mg/dL (ref 6–20)
CALCIUM: 9 mg/dL (ref 8.9–10.3)
CO2: 26 mmol/L (ref 22–32)
CREATININE: 1.59 mg/dL — AB (ref 0.44–1.00)
Chloride: 112 mmol/L — ABNORMAL HIGH (ref 101–111)
GFR, EST AFRICAN AMERICAN: 43 mL/min — AB (ref >60)
GFR, EST NON AFRICAN AMERICAN: 37 mL/min — AB (ref >60)
Glucose, Bld: 104 mg/dL — ABNORMAL HIGH (ref 65–99)
Potassium: 3.9 mmol/L (ref 3.5–5.1)
Sodium: 142 mmol/L (ref 135–145)

## 2015-12-30 LAB — ANTISTREPTOLYSIN O TITER: ASO: 1551 [IU]/mL — AB (ref 0.0–200.0)

## 2015-12-30 LAB — RHEUMATOID FACTOR: Rhuematoid fact SerPl-aCnc: <10 IU/mL (ref 0.0–13.9)

## 2015-12-30 MED ORDER — PREDNISONE 10 MG PO TABS
60.0000 mg | ORAL_TABLET | Freq: Every day | ORAL | Status: DC
  Administered 2015-12-31: 60 mg via ORAL
  Filled 2015-12-30: qty 1

## 2015-12-30 MED ORDER — IBUPROFEN 400 MG PO TABS: 800.0000 mg | ORAL_TABLET | Freq: Four times a day (QID) | ORAL | Status: DC

## 2015-12-30 MED ORDER — MORPHINE SULFATE (PF) 2 MG/ML IV SOLN
1.0000 mg | INTRAVENOUS | Status: DC | PRN
  Administered 2015-12-30 – 2015-12-31 (×4): 1 mg via INTRAVENOUS
  Filled 2015-12-30 (×4): qty 1

## 2015-12-30 NOTE — Progress Notes (Signed)
OT Cancellation Note  Patient Details Name: Amy Gibbs MRN: [id number] DOB: 09/06/1965   Cancelled Treatment:    Reason Eval/Treat Not Completed: Other (comment).  Pt is awaiting pain medication; states pain is "real bad" right now. Will check back for OT eval  Inara Dike 12/30/2015, 11:13 AM  Marica OtterMaryellen Alex Mcmanigal, OTR/L [phone] 12/30/2015

## 2015-12-30 NOTE — Evaluation (Addendum)
Occupational Therapy Evaluation Patient Details Name: Amy Situonya Gandolfo MRN: [id number] DOB: 04/09/1965 Today's Date: 12/30/2015    History of Present Illness Amy Gibbs is a 50 y.o. female with medical history significant for hypertension, cocaine abuse, chronic back pain, anxiety presents [facility] chief complaint of bilateral knee pain and swelling. Initial workup reveals acute renal failure, bilateral effusions at knees, mild leukocytosis concern for septic arthritis versus cellulitis. Bilateral knees were aspirated which was consistent with inflammatory findings rather than septic joint.    Clinical Impression   Pt was admitted for the above. Seen for limited OT evaluation, as premedication only helped a little, and pt may discharge today.  Issued AE to help improve independence and decrease pain for ADLs (long sponge, reacher and sock aide).  Goals are set for supervision level in acute, in case pt stays beyond today.    Follow Up Recommendations   (not eligible for post acute therapy services)    Equipment Recommendations  3 in 1 bedside comode (delivered)    Recommendations for Other Services       Precautions / Restrictions Precautions Precautions: Fall Restrictions Weight Bearing Restrictions: No      Mobility Bed Mobility               General bed mobility comments: not performed by OT:  she was supervision with PT  Transfers                 General transfer comment: not performed by OT today    Balance                                            ADL Overall ADL's : Needs assistance/impaired                                       General ADL Comments: performed limited evaluation from bed level.  Pt  was premedicated but pain level was still high. She also felt sleepy after medication.  Pt states she had some difficulty with LB adls at baseline due to pain in her back, knees and L foot.  Educated on AE, and pt used from bed level (long sponge, reacher and sock aide.  She wears slide on shoes/slippers, so she doesn't need a shoe horn.  Educated on rolling over in bed to get in/out to minimize back strain and using AE to help with this.  She has a tub shower combo at home.  Educated that 3:1 can be placed facing out from long wall and she can sit on it and have someone put trash can outside to support legs as well as cutting an inexpensive shower curtain liner around feet to keep water in tub, if she cannot tolerate stepping over tub.  Pt will not have follow up therapy:  she couldn't think of anything else that would be a problem for her.     Vision     Perception     Praxis      Pertinent Vitals/Pain Pain Score: 8  Pain Location: bil knees Pain Descriptors / Indicators: Aching Pain Intervention(s): Limited activity within patient's tolerance;Monitored during session;Premedicated before session;Repositioned     Hand Dominance     Extremity/Trunk Assessment Upper Extremity Assessment Upper Extremity Assessment: [facility]  for tasks assessed           Communication Communication Communication: No difficulties   Cognition Arousal/Alertness: Awake/alert Behavior During Therapy: WFL for tasks assessed/performed Overall Cognitive Status: Within Functional Limits for tasks assessed                     General Comments       Exercises       Shoulder Instructions      Home Living Family/patient expects to be discharged to:: Private residence Living Arrangements: Non-relatives/Friends Available Help at Discharge: Family;Available PRN/intermittently               Bathroom Shower/Tub: Tub/shower unit Shower/tub characteristics: Engineer, building services: Standard     Home Equipment: None          Prior Functioning/Environment Level of Independence: Independent                 OT Problem List: Decreased strength;Decreased activity  tolerance;Decreased knowledge of use of DME or AE;Pain (balance NT)   OT Treatment/Interventions: Self-care/ADL training;DME and/or AE instruction;Therapeutic activities;Patient/family education    OT Goals(Current goals can be found in the care plan section) Acute Rehab OT Goals Patient Stated Goal: less pain OT Goal Formulation: With patient Time For Goal Achievement: Jan 16, 2016 Potential to Achieve Goals: Good ADL Goals Pt Will Transfer to Toilet: with supervision;ambulating;bedside commode Pt Will Perform Tub/Shower Transfer: Tub transfer;with min assist;ambulating;3 in 1 (with 3:1 facing out) Additional ADL Goal #1: pt will gather clothes with reacher and complete adl at supervision level  OT Frequency: Min 2X/week   Barriers to D/C:            Co-evaluation              End of Session    Activity Tolerance: Patient limited by fatigue;Patient limited by pain Patient left: in bed;with call bell/phone within reach   Time: 1152-1204 OT Time Calculation (min): 12 min Charges:  OT General Charges $OT Visit: 1 Procedure OT Evaluation $OT Eval Moderate Complexity: 1 Procedure G-Codes:    Taraji Mungo 16-Jan-2016, 12:22 PM  Marica Otter, OTR/L [phone] Jan 16, 2016

## 2015-12-30 NOTE — Progress Notes (Signed)
PROGRESS NOTE    Amy Gibbs  ZOX:[id number] DOB: [birth year] DOA: 12/26/2015 PCP: No PCP Per Patient     Brief Narrative:  Amy Gibbs is a 50 y.o. female with medical history significant for hypertension, cocaine abuse, chronic back pain, anxiety presents from [facility] chief complaint of bilateral knee pain and swelling. Initial workup reveals acute renal failure, bilateral effusions at knees, mild leukocytosis concern for septic arthritis versus cellulitis. Bilateral knees were aspirated which was consistent with inflammatory findings rather than septic joint.    Assessment & Plan:   Principal Problem:   Cellulitis Active Problems:   Hypertension   Cocaine abuse   Anxiety   Acute kidney injury (HCC)   Sepsis (HCC)   Trichomoniasis    Bilateral knee effusions and pain -X-ray of bilateral knee with large joint effusion -Appreciate orthopedic surgery: more likely reactive arthritis vs other rheumatologic inflammatory disease. Not septic arthritis. Await final aspirate culture. Arthropathy labs pending.  -HIV neg, hep C neg   -Prednisone, pain med  -PT/OT   -Stop vanco. I doubt cellulitis at this point.   Trichomonas -Check chlamydia/gonorrhea  -Flagyl for 7 days   AKI -Urinalysis negative for UTI, renal ultrasound unremarkable -Improving with IV fluids, possibly due to dehydration as a cause  -Unknown baseline Cr function   Essential hypertension -Uncontrolled. Noncompliance with previously prescribed Norvasc, hydrochlorothiazide. Restart these medications -Monitor blood pressure - improved   History of cocaine abuse -UDS positive for cocaine and benzo   DVT prophylaxis: lovenox Code Status: full Family Communication: daughter at bedside  Disposition Plan: pending further improvement   Consultants:   Orthopedic sx  Procedures:   None  Antimicrobials:  Anti-infectives    Start     Dose/Rate Route Frequency Ordered Stop   12/29/15  1330  metroNIDAZOLE (FLAGYL) tablet 500 mg     500 mg Oral Every 12 hours 12/29/15 1328 01/05/16 0959   12/27/15 2200  vancomycin (VANCOCIN) IVPB 1000 mg/200 mL premix  Status:  Discontinued     1,000 mg 200 mL/hr over 60 Minutes Intravenous Every 24 hours 12/27/15 0101 12/30/15 1248   12/27/15 2200  ceFEPIme (MAXIPIME) 2 g in dextrose 5 % 50 mL IVPB  Status:  Discontinued     2 g 100 mL/hr over 30 Minutes Intravenous Every 24 hours 12/27/15 0101 12/28/15 1331   12/27/15 0016  ceFEPIme (MAXIPIME) 2 g injection    Comments:  Waldo Laine   : cabinet override      12/27/15 0016 12/27/15 1229   12/27/15 0015  ceFEPIme (MAXIPIME) 2 g in dextrose 5 % 50 mL IVPB     2 g 100 mL/hr over 30 Minutes Intravenous  Once 12/27/15 0011 12/27/15 0048   12/27/15 0015  vancomycin (VANCOCIN) IVPB 1000 mg/200 mL premix     1,000 mg 200 mL/hr over 60 Minutes Intravenous  Once 12/27/15 0011 12/27/15 0152   12/27/15 0000  vancomycin (VANCOCIN) IVPB 1000 mg/200 mL premix  Status:  Discontinued     1,000 mg 200 mL/hr over 60 Minutes Intravenous  Once 12/26/15 2348 12/26/15 2349   12/27/15 0000  ceFEPIme (MAXIPIME) 2 g in dextrose 5 % 50 mL IVPB  Status:  Discontinued     2 g 100 mL/hr over 30 Minutes Intravenous Every 8 hours 12/26/15 2348 12/26/15 2349       Subjective: Her knee pain worsened overnight. Pain meds are not helping. No fevers.    Objective: Vitals:   12/29/15 1737 12/29/15  2330 12/30/15 0448 12/30/15 0950  BP: (!) 141/99 125/86 124/83 (!) 145/97  Pulse: 65 63 (!) 57 69  Resp: 18 18 16 18   Temp: 98.1 F (36.7 C) 97.9 F (36.6 C) 97.7 F (36.5 C) 98.6 F (37 C)  TempSrc: Oral Oral Oral Oral  SpO2: 100% 100% 100% 98%  Weight:      Height:        Intake/Output Summary (Last 24 hours) at 12/30/15 1448 Last data filed at 12/30/15 0653  Gross per 24 hour  Intake              460 ml  Output                4 ml  Net              456 ml   Filed Weights   12/26/15 1859 12/27/15  0353 12/27/15 2033  Weight: 65.8 kg (145 lb) 72.1 kg (158 lb 14.4 oz) 72.5 kg (159 lb 13.3 oz)    Examination:  General exam: Appears calm and comfortable  Respiratory system: Clear to auscultation. Respiratory effort normal. Cardiovascular system: S1 & S2 heard, RRR. No JVD, murmurs, rubs, gallops or clicks. No pedal edema. Gastrointestinal system: Abdomen is nondistended, soft and nontender. No organomegaly or masses felt. Normal bowel sounds heard. Central nervous system: Alert and oriented. No focal neurological deficits. Extremities/Skin: Symmetric 5 x 5 power. Bilateral knees with erythema, swelling, tender to palpation, and warmth, right knee larger than left, left plantar surface of foot with about 2in diameter blister vs cyst - monitor  Psychiatry: Judgement and insight appear normal. Mood & affect appropriate.   Data Reviewed: I have personally reviewed following labs and imaging studies  CBC:  Recent Labs Lab 12/26/15 2050 12/28/15 0602 12/29/15 0521 12/30/15 0742  WBC 13.0* 11.2* 9.7 7.5  NEUTROABS 8.4*  --  4.7 3.7  HGB 12.2 11.0* 10.6* 10.8*  HCT 36.9 33.7* 33.1* 33.7*  MCV 92.9 93.9 94.0 93.9  PLT 331 317 324 350   Basic Metabolic Panel:  Recent Labs Lab 12/26/15 2050 12/28/15 0602 12/29/15 0521 12/30/15 0742  NA 138 141 140 142  K 3.6 3.7 3.6 3.9  CL 105 109 110 112*  CO2 25 24 23 26   GLUCOSE 129* 117* 139* 104*  BUN 24* 20 23* 16  CREATININE 1.98* 1.69* 1.64* 1.59*  CALCIUM 9.3 9.0 8.6* 9.0   GFR: Estimated Creatinine Clearance: 41.6 mL/min (by C-G formula based on SCr of 1.59 mg/dL (H)). Liver Function Tests: No results for input(s): AST, ALT, ALKPHOS, BILITOT, PROT, ALBUMIN in the last 168 hours. No results for input(s): LIPASE, AMYLASE in the last 168 hours. No results for input(s): AMMONIA in the last 168 hours. Coagulation Profile: No results for input(s): INR, PROTIME in the last 168 hours. Cardiac Enzymes: No results for input(s):  CKTOTAL, CKMB, CKMBINDEX, TROPONINI in the last 168 hours. BNP (last 3 results) No results for input(s): PROBNP in the last 8760 hours. HbA1C: No results for input(s): HGBA1C in the last 72 hours. CBG: No results for input(s): GLUCAP in the last 168 hours. Lipid Profile: No results for input(s): CHOL, HDL, LDLCALC, TRIG, CHOLHDL, LDLDIRECT in the last 72 hours. Thyroid Function Tests: No results for input(s): TSH, T4TOTAL, FREET4, T3FREE, THYROIDAB in the last 72 hours. Anemia Panel: No results for input(s): VITAMINB12, FOLATE, FERRITIN, TIBC, IRON, RETICCTPCT in the last 72 hours. Sepsis Labs: No results for input(s): PROCALCITON, LATICACIDVEN in the last 168 hours.  Recent Results (from the past 240 hour(s))  Culture, body fluid-bottle     Status: None (Preliminary result)   Collection Time: 12/27/15  4:00 PM  Result Value Ref Range Status   Specimen Description SYNOVIAL LEFT KNEE  Final   Special Requests BOTTLES DRAWN AEROBIC AND ANAEROBIC 10CC  Final   Culture NO GROWTH 2 DAYS  Final   Report Status PENDING  Incomplete  Gram stain     Status: None   Collection Time: 12/27/15  4:00 PM  Result Value Ref Range Status   Specimen Description SYNOVIAL LEFT KNEE  Final   Special Requests NONE  Final   Gram Stain   Final    ABUNDANT WBC PRESENT, PREDOMINANTLY PMN NO ORGANISMS SEEN    Report Status 12/27/2015 FINAL  Final  Culture, body fluid-bottle     Status: None (Preliminary result)   Collection Time: 12/27/15  4:00 PM  Result Value Ref Range Status   Specimen Description SYNOVIAL RIGHT KNEE  Final   Special Requests BOTTLES DRAWN AEROBIC AND ANAEROBIC 10CC  Final   Culture NO GROWTH 2 DAYS  Final   Report Status PENDING  Incomplete  Gram stain     Status: None   Collection Time: 12/27/15  4:00 PM  Result Value Ref Range Status   Specimen Description SYNOVIAL RIGHT KNEE  Final   Special Requests NONE  Final   Gram Stain   Final    ABUNDANT WBC PRESENT, PREDOMINANTLY  PMN NO ORGANISMS SEEN    Report Status 12/27/2015 FINAL  Final       Radiology Studies: No results found.    Scheduled Meds: . amLODipine  5 mg Oral Daily  . docusate sodium  100 mg Oral Daily  . enoxaparin (LOVENOX) injection  40 mg Subcutaneous Q24H  . metroNIDAZOLE  500 mg Oral Q12H  . polyethylene glycol  17 g Oral Daily  . [START ON 12/31/2015] predniSONE  60 mg Oral Q breakfast   Continuous Infusions: . sodium chloride 100 mL/hr at 12/29/15 1457     LOS: 3 days    Time spent: 25 minutes   Noralee StainJennifer Emani Morad, DO [facility] [email or web address] Password [email or web address] 12/30/2015, 2:48 PM

## 2015-12-30 NOTE — Plan of Care (Signed)
Problem: Safety: Goal: Ability to remain free from injury will improve Outcome: Progressing Pt understands to call before getting up. Uses call bell to alert staff.

## 2015-12-30 NOTE — Progress Notes (Signed)
Orthopaedic Trauma Service F/u   Labs reviewed  HIV and hep C negative  GC/Chlamydia pending  + trichomoniasis noted on u/a  Additional arthropathy labs are pending   RF is within reference range   Markedly elevated ASO titer  Results for ARALYNN, BRAKE (MRN [id number]) as of 12/30/2015 17:38  Ref. Range 12/29/2015 14:19  ASO Latest Ref Range: 0.0 - 200.0 IU/mL 1,551.0 (H)   Markedly elevated ESR and CRP on admission   Results for TYRENE, NADER (MRN [id number]) as of 12/30/2015 17:38  Ref. Range 12/26/2015 20:50  CRP Latest Ref Range: <1.0 mg/dL 10.4 (H)  Results for SABRINIA, PRIEN (MRN [id number]) as of 12/30/2015 17:38  Ref. Range 12/26/2015 20:50  Sed Rate Latest Ref Range: 0 - 22 mm/hr 82 (H)   Again patient with migratory polyarthralgias   Definitely has the appearance of reactive arthritis and appears to be possibly related to post-streptococcal in nature   Would recommend ID consult, ? Rheumatic fever spectrum  ? Need for abx therapy and cardiac evaluation   Previously Exam was notable for B knee pain and effusions Pt also has lesion on plantar aspect of Left foot, ? Subcutaneous nodule   Jari Pigg, PA-C [facility] [phone] (P) 12/30/2015 6:12 PM

## 2015-12-31 DIAGNOSIS — B955 Unspecified streptococcus as the cause of diseases classified elsewhere: Secondary | ICD-10-CM

## 2015-12-31 DIAGNOSIS — K1370 Unspecified lesions of oral mucosa: Secondary | ICD-10-CM

## 2015-12-31 DIAGNOSIS — F17210 Nicotine dependence, cigarettes, uncomplicated: Secondary | ICD-10-CM

## 2015-12-31 DIAGNOSIS — M0029 Other streptococcal polyarthritis: Secondary | ICD-10-CM

## 2015-12-31 DIAGNOSIS — Z8261 Family history of arthritis: Secondary | ICD-10-CM

## 2015-12-31 DIAGNOSIS — D72828 Other elevated white blood cell count: Secondary | ICD-10-CM

## 2015-12-31 LAB — BASIC METABOLIC PANEL
ANION GAP: 7 (ref 5–15)
BUN: 17 mg/dL (ref 6–20)
CALCIUM: 8.9 mg/dL (ref 8.9–10.3)
CO2: 23 mmol/L (ref 22–32)
Chloride: 111 mmol/L (ref 101–111)
Creatinine, Ser: 1.56 mg/dL — ABNORMAL HIGH (ref 0.44–1.00)
GFR calc Af Amer: 44 mL/min — ABNORMAL LOW (ref >60)
GFR, EST NON AFRICAN AMERICAN: 38 mL/min — AB (ref >60)
GLUCOSE: 99 mg/dL (ref 65–99)
Potassium: 4 mmol/L (ref 3.5–5.1)
Sodium: 141 mmol/L (ref 135–145)

## 2015-12-31 LAB — CBC WITH DIFFERENTIAL/PLATELET
BASOS ABS: 0 10*3/uL (ref 0.0–0.1)
Basophils Relative: 0 %
EOS PCT: 0 %
Eosinophils Absolute: 0 10*3/uL (ref 0.0–0.7)
HCT: 31.6 % — ABNORMAL LOW (ref 36.0–46.0)
Hemoglobin: 10.4 g/dL — ABNORMAL LOW (ref 12.0–15.0)
LYMPHS PCT: 28 %
Lymphs Abs: 4.6 10*3/uL — ABNORMAL HIGH (ref 0.7–4.0)
MCH: 30.4 pg (ref 26.0–34.0)
MCHC: 32.9 g/dL (ref 30.0–36.0)
MCV: 92.4 fL (ref 78.0–100.0)
MONO ABS: 0.8 10*3/uL (ref 0.1–1.0)
Monocytes Relative: 5 %
Neutro Abs: 11.3 10*3/uL — ABNORMAL HIGH (ref 1.7–7.7)
Neutrophils Relative %: 67 %
PLATELETS: 332 10*3/uL (ref 150–400)
RBC: 3.42 MIL/uL — ABNORMAL LOW (ref 3.87–5.11)
RDW: 14.8 % (ref 11.5–15.5)
WBC: 16.8 10*3/uL — ABNORMAL HIGH (ref 4.0–10.5)

## 2015-12-31 LAB — ANTI-DNA ANTIBODY, DOUBLE-STRANDED: DS DNA AB: 1 [IU]/mL (ref 0–9)

## 2015-12-31 LAB — ANTI-DNASE B ANTIBODY: Anti-DNAse-B: 1150 U/mL — ABNORMAL HIGH (ref 0–120)

## 2015-12-31 LAB — ANTINUCLEAR ANTIBODIES, IFA: ANTINUCLEAR ANTIBODIES, IFA: NEGATIVE

## 2015-12-31 MED ORDER — HYDROMORPHONE HCL 1 MG/ML IJ SOLN
1.0000 mg | INTRAMUSCULAR | Status: DC | PRN
  Administered 2015-12-31 – 2016-01-01 (×6): 1 mg via INTRAVENOUS
  Filled 2015-12-31 (×6): qty 1

## 2015-12-31 MED ORDER — IBUPROFEN 400 MG PO TABS
800.0000 mg | ORAL_TABLET | Freq: Four times a day (QID) | ORAL | Status: DC
  Administered 2015-12-31 – 2016-01-01 (×4): 800 mg via ORAL
  Filled 2015-12-31 (×5): qty 2

## 2015-12-31 MED ORDER — ACETAMINOPHEN 325 MG PO TABS
650.0000 mg | ORAL_TABLET | Freq: Four times a day (QID) | ORAL | Status: DC
  Administered 2015-12-31 – 2016-01-02 (×9): 650 mg via ORAL
  Filled 2015-12-31 (×9): qty 2

## 2015-12-31 MED ORDER — AMLODIPINE BESYLATE 10 MG PO TABS
10.0000 mg | ORAL_TABLET | Freq: Every day | ORAL | Status: DC
Start: 2016-01-01 — End: 2016-01-02
  Administered 2016-01-01 – 2016-01-02 (×2): 10 mg via ORAL
  Filled 2015-12-31 (×2): qty 1

## 2015-12-31 MED ORDER — HYDROMORPHONE HCL 2 MG/ML IJ SOLN: 1.0000 mg | INTRAMUSCULAR | Status: DC | PRN

## 2015-12-31 NOTE — Progress Notes (Signed)
Occupational Therapy Treatment and Discharge Patient Details Name: Amy Gibbs MRN: [id number] DOB: 28-Jun-1965 Today's Date: 12/31/2015    History of present illness Enrika Aguado is a 50 y.o. female with medical history significant for hypertension, cocaine abuse, chronic back pain, anxiety presents from [facility] chief complaint of bilateral knee pain and swelling. Initial workup reveals acute renal failure, bilateral effusions at knees, mild leukocytosis concern for septic arthritis versus cellulitis. Bilateral knees were aspirated which was consistent with inflammatory findings rather than septic joint.    OT comments  Pt is performing ADL and ADL transfers at a supervision to min guard level. She was able to recall all education provided during OT evaluation. No further OT needs.   Follow Up Recommendations  No OT follow up    Equipment Recommendations       Recommendations for Other Services      Precautions / Restrictions Precautions Precautions: None Restrictions Weight Bearing Restrictions: No       Mobility Bed Mobility Overal bed mobility: Modified Independent             General bed mobility comments: HOB up, no assist  Transfers Overall transfer level: Needs assistance Equipment used: None Transfers: Sit to/from Stand Sit to Stand: Supervision         General transfer comment: Used UEs to push up; safe technique    Balance             Standing balance-Leahy Scale: Fair (approaching Good)                     ADL Overall ADL's : Needs assistance/impaired     Grooming: Wash/dry hands;Standing;Supervision/safety                   Toilet Transfer: Sales executive;Ambulation   Toileting- Clothing Manipulation and Hygiene: Supervision/safety;Sit to/from stand   Tub/ Shower Transfer: Min guard;Ambulation;Tub transfer   Functional mobility during ADLs: Supervision/safety General ADL  Comments: Pt recalled education provided last OT visit in use of 3 in 1 and AE. Pt moving about her room without a device to gather items for ADL with supervision.       Vision                     Perception     Praxis      Cognition   Behavior During Therapy: WFL for tasks assessed/performed Overall Cognitive Status: Within Functional Limits for tasks assessed                       Extremity/Trunk Assessment               Exercises     Shoulder Instructions       General Comments      Pertinent Vitals/ Pain       Pain Assessment: Faces Pain Score:  (8 L knee; 7 Right knee) Faces Pain Scale: Hurts even more Pain Location: B knees Pain Descriptors / Indicators: Aching;Guarding Pain Intervention(s): Monitored during session;Repositioned  Home Living                                          Prior Functioning/Environment              Frequency           Progress Toward Goals  OT Goals(current goals can now be found in the care plan section)  Progress towards OT goals: Goals met/education completed, patient discharged from OT  Acute Rehab OT Goals Patient Stated Goal: health care after discharge  Plan Discharge plan remains appropriate;All goals met and education completed, patient discharged from OT services    Co-evaluation                 End of Session     Activity Tolerance Patient tolerated treatment well   Patient Left in bed;with call bell/phone within reach   Nurse Communication          Time: 1445-1458 OT Time Calculation (min): 13 min  Charges: OT General Charges $OT Visit: 1 Procedure OT Treatments $Self Care/Home Management : 8-22 mins  Malka So 12/31/2015, 3:06 PM

## 2015-12-31 NOTE — Progress Notes (Signed)
PROGRESS NOTE    Amy Gibbs  ZOX:[id number] DOB: Aug 11, 1965 DOA: 12/26/2015 PCP: No PCP Per Patient     Brief Narrative:  Amy Gibbs is a 50 y.o. female with medical history significant for hypertension, cocaine abuse, chronic back pain, anxiety presents from [facility] chief complaint of bilateral knee pain and swelling. Initial workup reveals acute renal failure, bilateral effusions at knees, mild leukocytosis concern for septic arthritis versus cellulitis. Orthopedic surgery was consulted. Bilateral knees were aspirated which was consistent with inflammatory findings rather than septic joint.   Further lab testing showed elevated ASO of 1500. Upon further questioning, patient does admit to having sore throat, chest cold, as well as throat swelling 2 weeks ago. This all resolved after a couple of days. She does state that she has had these intermittent episodes of sore throat and throat swelling which spontaneously resolve, followed by intermittent acute on chronic knee pain. She believes she has had these intermittent episodes for 3-5 years.   Assessment & Plan:   Principal Problem:   Poststreptococcal arthritis of multiple sites [facility]) Active Problems:   Hypertension   Cocaine abuse   Anxiety   Acute kidney injury (HCC)   Trichomoniasis    Poststrep arthritis  -X-ray of bilateral knee with large joint effusion   -S/p bilateral joint aspiration. Appreciate orthopedic surgery: more likely reactive arthritis vs other rheumatologic inflammatory disease. Not septic arthritis. Stop antibiotics. Await final aspirate culture. Arthropathy labs pending.  -PT/OT   -ASO titer: 1551 and history consistent with pharyngitis about 2 weeks ago. Consulted infectious disease today.  -Check echo   Trichomonas -HIV neg, hep C neg   -Check chlamydia/gonorrhea  -Flagyl for 7 days   AKI -Urinalysis negative for UTI, renal ultrasound unremarkable -Improving with IV fluids,  possibly due to dehydration as a cause vs PSGN? No hematuria on UA. Check complement levels.  -Unknown baseline Cr function   -Stable   Essential hypertension -Uncontrolled. Noncompliance with previously prescribed Norvasc (inc dose to 10mg ), hydrochlorothiazide. Restart these medications.  -Monitor blood pressure  History of cocaine abuse -UDS positive for cocaine and benzo   DVT prophylaxis: lovenox Code Status: full Family Communication: no family at bedside  Disposition Plan: pending further work up and improvement.    Consultants:   Orthopedic sx  Infectious disease  Procedures:   None  Antimicrobials:  Anti-infectives    Start     Dose/Rate Route Frequency Ordered Stop   12/29/15 1330  metroNIDAZOLE (FLAGYL) tablet 500 mg     500 mg Oral Every 12 hours 12/29/15 1328 01/05/16 0959   12/27/15 2200  vancomycin (VANCOCIN) IVPB 1000 mg/200 mL premix  Status:  Discontinued     1,000 mg 200 mL/hr over 60 Minutes Intravenous Every 24 hours 12/27/15 0101 12/30/15 1248   12/27/15 2200  ceFEPIme (MAXIPIME) 2 g in dextrose 5 % 50 mL IVPB  Status:  Discontinued     2 g 100 mL/hr over 30 Minutes Intravenous Every 24 hours 12/27/15 0101 12/28/15 1331   12/27/15 0016  ceFEPIme (MAXIPIME) 2 g injection    Comments:  Waldo Laine   : cabinet override      12/27/15 0016 12/27/15 1229   12/27/15 0015  ceFEPIme (MAXIPIME) 2 g in dextrose 5 % 50 mL IVPB     2 g 100 mL/hr over 30 Minutes Intravenous  Once 12/27/15 0011 12/27/15 0048   12/27/15 0015  vancomycin (VANCOCIN) IVPB 1000 mg/200 mL premix     1,000  mg 200 mL/hr over 60 Minutes Intravenous  Once 12/27/15 0011 12/27/15 0152   12/27/15 0000  vancomycin (VANCOCIN) IVPB 1000 mg/200 mL premix  Status:  Discontinued     1,000 mg 200 mL/hr over 60 Minutes Intravenous  Once 12/26/15 2348 12/26/15 2349   12/27/15 0000  ceFEPIme (MAXIPIME) 2 g in dextrose 5 % 50 mL IVPB  Status:  Discontinued     2 g 100 mL/hr over 30 Minutes  Intravenous Every 8 hours 12/26/15 2348 12/26/15 2349       Subjective: Patient states that her knees feel slightly better from yesterday, however, not back to baseline. No fevers overnight, no other new symptoms today.   Objective: Vitals:   12/30/15 0950 12/30/15 2207 12/31/15 0607 12/31/15 0945  BP: (!) 145/97 (!) 156/103 (!) 158/93 (!) 153/100  Pulse: 69 77 85 79  Resp: 18 19 17 16   Temp: 98.6 F (37 C) 97.8 F (36.6 C) 98.5 F (36.9 C) 98.2 F (36.8 C)  TempSrc: Oral Oral Oral Oral  SpO2: 98% 100% 100% 100%  Weight:  78.1 kg (172 lb 3.2 oz)    Height:        Intake/Output Summary (Last 24 hours) at 12/31/15 1038 Last data filed at 12/31/15 0945  Gross per 24 hour  Intake              820 ml  Output              650 ml  Net              170 ml   Filed Weights   12/27/15 0353 12/27/15 2033 12/30/15 2207  Weight: 72.1 kg (158 lb 14.4 oz) 72.5 kg (159 lb 13.3 oz) 78.1 kg (172 lb 3.2 oz)    Examination:  General exam: Appears calm and comfortable  Respiratory system: Clear to auscultation. Respiratory effort normal. Cardiovascular system: S1 & S2 heard, RRR. +2 systolic murmur Gastrointestinal system: Abdomen is nondistended, soft and nontender. No organomegaly or masses felt. Normal bowel sounds heard. Central nervous system: Alert and oriented. No focal neurological deficits. Extremities/Skin: Symmetric 5 x 5 power. Bilateral knees with erythema, swelling, tender to palpation, and warmth, right knee larger than left, left plantar surface of foot with about 2in diameter (painful) blister vs cyst  Psychiatry: Judgement and insight appear normal. Mood & affect appropriate.   Data Reviewed: I have personally reviewed following labs and imaging studies  CBC:  Recent Labs Lab 12/26/15 2050 12/28/15 0602 12/29/15 0521 12/30/15 0742 12/31/15 0359  WBC 13.0* 11.2* 9.7 7.5 16.8*  NEUTROABS 8.4*  --  4.7 3.7 11.3*  HGB 12.2 11.0* 10.6* 10.8* 10.4*  HCT 36.9 33.7*  33.1* 33.7* 31.6*  MCV 92.9 93.9 94.0 93.9 92.4  PLT 331 317 324 350 332   Basic Metabolic Panel:  Recent Labs Lab 12/26/15 2050 12/28/15 0602 12/29/15 0521 12/30/15 0742 12/31/15 0359  NA 138 141 140 142 141  K 3.6 3.7 3.6 3.9 4.0  CL 105 109 110 112* 111  CO2 25 24 23 26 23   GLUCOSE 129* 117* 139* 104* 99  BUN 24* 20 23* 16 17  CREATININE 1.98* 1.69* 1.64* 1.59* 1.56*  CALCIUM 9.3 9.0 8.6* 9.0 8.9   GFR: Estimated Creatinine Clearance: 47 mL/min (by C-G formula based on SCr of 1.56 mg/dL (H)). Liver Function Tests: No results for input(s): AST, ALT, ALKPHOS, BILITOT, PROT, ALBUMIN in the last 168 hours. No results for input(s): LIPASE, AMYLASE in the  last 168 hours. No results for input(s): AMMONIA in the last 168 hours. Coagulation Profile: No results for input(s): INR, PROTIME in the last 168 hours. Cardiac Enzymes: No results for input(s): CKTOTAL, CKMB, CKMBINDEX, TROPONINI in the last 168 hours. BNP (last 3 results) No results for input(s): PROBNP in the last 8760 hours. HbA1C: No results for input(s): HGBA1C in the last 72 hours. CBG: No results for input(s): GLUCAP in the last 168 hours. Lipid Profile: No results for input(s): CHOL, HDL, LDLCALC, TRIG, CHOLHDL, LDLDIRECT in the last 72 hours. Thyroid Function Tests: No results for input(s): TSH, T4TOTAL, FREET4, T3FREE, THYROIDAB in the last 72 hours. Anemia Panel: No results for input(s): VITAMINB12, FOLATE, FERRITIN, TIBC, IRON, RETICCTPCT in the last 72 hours. Sepsis Labs: No results for input(s): PROCALCITON, LATICACIDVEN in the last 168 hours.  Recent Results (from the past 240 hour(s))  Culture, body fluid-bottle     Status: None (Preliminary result)   Collection Time: 12/27/15  4:00 PM  Result Value Ref Range Status   Specimen Description SYNOVIAL LEFT KNEE  Final   Special Requests BOTTLES DRAWN AEROBIC AND ANAEROBIC 10CC  Final   Culture NO GROWTH 3 DAYS  Final   Report Status PENDING   Incomplete  Gram stain     Status: None   Collection Time: 12/27/15  4:00 PM  Result Value Ref Range Status   Specimen Description SYNOVIAL LEFT KNEE  Final   Special Requests NONE  Final   Gram Stain   Final    ABUNDANT WBC PRESENT, PREDOMINANTLY PMN NO ORGANISMS SEEN    Report Status 12/27/2015 FINAL  Final  Culture, body fluid-bottle     Status: None (Preliminary result)   Collection Time: 12/27/15  4:00 PM  Result Value Ref Range Status   Specimen Description SYNOVIAL RIGHT KNEE  Final   Special Requests BOTTLES DRAWN AEROBIC AND ANAEROBIC 10CC  Final   Culture NO GROWTH 3 DAYS  Final   Report Status PENDING  Incomplete  Gram stain     Status: None   Collection Time: 12/27/15  4:00 PM  Result Value Ref Range Status   Specimen Description SYNOVIAL RIGHT KNEE  Final   Special Requests NONE  Final   Gram Stain   Final    ABUNDANT WBC PRESENT, PREDOMINANTLY PMN NO ORGANISMS SEEN    Report Status 12/27/2015 FINAL  Final  Culture, Urine     Status: None   Collection Time: 12/29/15  1:44 PM  Result Value Ref Range Status   Specimen Description URINE, RANDOM  Final   Special Requests NONE  Final   Culture NO GROWTH  Final   Report Status 12/30/2015 FINAL  Final       Radiology Studies: No results found.    Scheduled Meds: . [START ON 01/01/2016] amLODipine  10 mg Oral Daily  . docusate sodium  100 mg Oral Daily  . enoxaparin (LOVENOX) injection  40 mg Subcutaneous Q24H  . metroNIDAZOLE  500 mg Oral Q12H  . polyethylene glycol  17 g Oral Daily   Continuous Infusions: . sodium chloride 100 mL/hr at 12/31/15 0519     LOS: 4 days    Time spent: 30 minutes   Noralee Stain, DO [facility] [email or web address] Password [email or web address] 12/31/2015, 10:38 AM

## 2015-12-31 NOTE — Progress Notes (Signed)
Physical Therapy Treatment Patient Details Name: Sophya Vanblarcom MRN: [id number] DOB: 1966-03-12 Today's Date: 12/31/2015    History of Present Illness Tinya Cadogan is a 50 y.o. female with medical history significant for hypertension, cocaine abuse, chronic back pain, anxiety presents from [facility] chief complaint of bilateral knee pain and swelling. Initial workup reveals acute renal failure, bilateral effusions at knees, mild leukocytosis concern for septic arthritis versus cellulitis. Bilateral knees were aspirated which was consistent with inflammatory findings rather than septic joint.     PT Comments    Excellent improvements in functional mobility and activity tolerance; not completely dependent on RW -- able to tolerate full weight both knees for in room amb -- RW for longer distances; will update recommendations to Outpatient PT follow up  Follow Up Recommendations  Outpatient PT     Equipment Recommendations  Rolling walker with 5" wheels    Recommendations for Other Services       Precautions / Restrictions Precautions Precautions: None    Mobility  Bed Mobility                  Transfers   Equipment used: None Transfers: Sit to/from Stand Sit to Stand: Supervision         General transfer comment: Used UEs to push up; safe technique  Ambulation/Gait Ambulation/Gait assistance: Supervision Ambulation Distance (Feet): 65 Feet Assistive device: None;Rolling walker (2 wheeled) Gait Pattern/deviations: Decreased step length - right;Decreased step length - left     General Gait Details: Walked in room without assistive device; short step length; bil knees slightly flexed in stance   Stairs            Wheelchair Mobility    Modified Rankin (Stroke Patients Only)       Balance             Standing balance-Leahy Scale: Fair (approaching Good)                      Cognition Arousal/Alertness:  Awake/alert Behavior During Therapy: WFL for tasks assessed/performed Overall Cognitive Status: Within Functional Limits for tasks assessed                      Exercises      General Comments        Pertinent Vitals/Pain Pain Assessment: 0-10 Pain Score:  (8 L knee; 7 Right knee) Pain Location: Bil knees Pain Descriptors / Indicators: Aching Pain Intervention(s): Monitored during session    Home Living                      Prior Function            PT Goals (current goals can now be found in the care plan section) Acute Rehab PT Goals Patient Stated Goal: less pain PT Goal Formulation: With patient Time For Goal Achievement: 01/12/16 Potential to Achieve Goals: Good Progress towards PT goals: Progressing toward goals    Frequency    Min 3X/week      PT Plan Discharge plan needs to be updated    Co-evaluation             End of Session   Activity Tolerance: Patient tolerated treatment well Patient left:  (managing safely with RW in room)     Time: 4098-1191 PT Time Calculation (min) (ACUTE ONLY): 13 min  Charges:  $Gait Training: 8-22 mins  G Codes:      Van ClinesGarrigan, Elektra Wartman Hamff 12/31/2015, 1:36 PM  Van ClinesHolly Thorsten Climer, [place]PT  Acute Rehabilitation Services Pager [phone] Office [phone]

## 2015-12-31 NOTE — Consult Note (Signed)
[name]    Reason for Consult: Elevated ASO titer   Referring Physician: Dr. Maylene Roes  Principal Problem:   Poststreptococcal arthritis of multiple sites [facility]) Active Problems:   Hypertension   Cocaine abuse   Anxiety   Acute kidney injury ([place])   Trichomoniasis  HPI: Amy Gibbs is a 50 y.o. female with PMHx of HTN, Anxiety, and Cocaine abuse who was admitted on 10/12 from [place] for bilateral knee pain and swelling.   Initial work up revealed acute renal failure, with unknown baseline creatinine, large bilateral effusions of her knees and and mild leukocytosis. Patient underwent bilateral right and left knee aspirations with orthopedic surgery. And was started on Vancomycin and Cefepime for concern for septic arthritis. However, cultures returned no growth and work up was consistent with inflammatory changes. Vancomycin was stopped on 10/15 after 3 days of treatment and Cefepime was stopped after 10/13 after 2 days of treatment. Patient continued to have pain and mild swelling in her bilateral knees after aspiration.   Upon further questioning, patient states she has a long history of arthralgias and swelling for the past 5-10 years. She states that her mother, father and sister all have rheumatoid arthritis. She states she will often having swelling in her right hand and arthralgias in the morning for which she was seen at [facility] in the past for. Work up at that time in 2014 revealed a negative ANA, negative RA factor, elevated ESR of 35 and CRP of 11.5. Uric Acid 7.6. She will commonly have right or left knee pain and swelling or right or left ankle pain and swelling. This most recent presentation was unusual for her as the pain was much more severe and the swelling was greater than normal. She also states that both of her knees normally do not flare at the same time.   Patient admits to sore throat, dysphagia and a productive cough  one week prior to the onset of her bilateral knee pain and swelling. Her symptoms were not associated with fever, chills, nausea, vomiting, shortness of breath or diarrhea. She did admits to a "lump" under the angle of her jaw. Her symptoms resolved on their own after 3 days. She denies any sick contacts or regular contact with children.   Further work up this admission includes the following: CRP 10.4 (11.5 in 2014) ESR 82 (35 in 2014) UA: proteinuria, glucosuria, negative hemoglobin, negative bilirubin, hyaline casts, 0-5 WBC, 0-5 RBC Creatinine: 1.98 > 1.5 (0.69 in 2007) Synovial Fluid: Turbind, yellow, no crystals, increased WBC at 6,800, increased neutrophils at 96% Synovial Fluid Cx: NGTD in bilateral knees ANA: Negative (negative in 2014) RF: <10 (Negative in 2014) dsDNA Ab: Negative HLA-B27 Antigen: Pending Anti-DNAse B antibody: In process UCx: NGTD HIV: Non-reactive Acute Hepatitis: Negative Antistreptolysin O Titer: 1,551 (0-200) GC/Chlamydia: Pending Complement: Pending   Past Medical History:  Diagnosis Date  . Anxiety   . Arthritis   . Chronic back pain   . Chronic knee pain   . Cocaine abuse   . High cholesterol   . Hypertension   . Joint pain   . Trichomoniasis 12/29/2015    Allergies: No Known Allergies  Current antibiotics: None  MEDICATIONS: . acetaminophen  650 mg Oral Q6H  . [START ON 01/01/2016] amLODipine  10 mg Oral Daily  . docusate sodium  100 mg Oral Daily  . enoxaparin (LOVENOX) injection  40 mg Subcutaneous Q24H  . ibuprofen  800 mg  Oral QID  . metroNIDAZOLE  500 mg Oral Q12H  . polyethylene glycol  17 g Oral Daily    Social History  Substance Use Topics  . Smoking status: Current Some Day Smoker    Packs/day: 0.50    Types: Cigarettes  . Smokeless tobacco: Never Used  . Alcohol use No   Family Hx: RA in mother, father, sister  Review of Systems: General: Denies fever, chills, fatigue, unexpected weight loss, change in appetite  and diaphoresis.  Respiratory: Denies SOB, cough   Cardiovascular: Denies chest pain and palpitations.  Gastrointestinal: Denies nausea, vomiting, abdominal pain, diarrhea Genitourinary: Denies dysuria, urgency, frequency. Denies vaginal discharge.  Musculoskeletal: Admits to bilateral knee pain and swelling and occasional ankle and hand arthralgias.  Skin: Denies pallor, rash and wounds.  Neurological: Denies dizziness, headaches, weakness, lightheadedness  OBJECTIVE: Vitals:   12/30/15 0950 12/30/15 2207 12/31/15 0607 12/31/15 0945  BP: (!) 145/97 (!) 156/103 (!) 158/93 (!) 153/100  Pulse: 69 77 85 79  Resp: 18 19 17 16   Temp: 98.6 F (37 C) 97.8 F (36.6 C) 98.5 F (36.9 C) 98.2 F (36.8 C)  TempSrc: Oral Oral Oral Oral  SpO2: 98% 100% 100% 100%  Weight:  172 lb 3.2 oz (78.1 kg)    Height:       General: Vital signs reviewed.  Patient is well-developed and well-nourished, in no acute distress and cooperative with exam.  Head: Normocephalic and atraumatic. Eyes: PERRLA, mild yellowing of sclera  Neck: Supple, trachea midline, no anterior cervical lymphadenopathy. +Submandibular fullness.  Mouth: +White lesions on posterior oropharynx.  Cardiovascular: RRR, 2/6 systolic murmur. Pulmonary/Chest: Clear to auscultation bilaterally, no wheezes, rales, or rhonchi. Abdominal: Soft, non-tender, non-distended, BS + Musculoskeletal: Mild effusion and edema noted of bilateral knees and mildly tender to palpation with increased warmth.  Extremities: No lower extremity edema bilaterally, pulses symmetric and intact bilaterally.  Skin: Warm, dry and intact. No rashes or erythema. Psychiatric: Normal mood and affect. speech and behavior is normal. Cognition and memory are normal.   LABS: Results for orders placed or performed during the hospital encounter of 12/26/15 (from the past 48 hour(s))  CBC with Differential/Platelet     Status: Abnormal   Collection Time: 12/30/15  7:42 AM    Result Value Ref Range   WBC 7.5 4.0 - 10.5 K/uL   RBC 3.59 (L) 3.87 - 5.11 MIL/uL   Hemoglobin 10.8 (L) 12.0 - 15.0 g/dL   HCT 33.7 (L) 36.0 - 46.0 %   MCV 93.9 78.0 - 100.0 fL   MCH 30.1 26.0 - 34.0 pg   MCHC 32.0 30.0 - 36.0 g/dL   RDW 14.9 11.5 - 15.5 %   Platelets 350 150 - 400 K/uL   Neutrophils Relative % 50 %   Neutro Abs 3.7 1.7 - 7.7 K/uL   Lymphocytes Relative 39 %   Lymphs Abs 2.9 0.7 - 4.0 K/uL   Monocytes Relative 9 %   Monocytes Absolute 0.7 0.1 - 1.0 K/uL   Eosinophils Relative 2 %   Eosinophils Absolute 0.2 0.0 - 0.7 K/uL   Basophils Relative 0 %   Basophils Absolute 0.0 0.0 - 0.1 K/uL  Basic metabolic panel     Status: Abnormal   Collection Time: 12/30/15  7:42 AM  Result Value Ref Range   Sodium 142 135 - 145 mmol/L   Potassium 3.9 3.5 - 5.1 mmol/L   Chloride 112 (H) 101 - 111 mmol/L   CO2 26 22 - 32  mmol/L   Glucose, Bld 104 (H) 65 - 99 mg/dL   BUN 16 6 - 20 mg/dL   Creatinine, Ser 1.59 (H) 0.44 - 1.00 mg/dL   Calcium 9.0 8.9 - 10.3 mg/dL   GFR calc non Af Amer 37 (L) >60 mL/min   GFR calc Af Amer 43 (L) >60 mL/min    Comment: (NOTE) The eGFR has been calculated using the CKD EPI equation. This calculation has not been validated in all clinical situations. eGFR's persistently <60 mL/min signify possible Chronic Kidney Disease.    Anion gap 4 (L) 5 - 15  CBC with Differential/Platelet     Status: Abnormal   Collection Time: 12/31/15  3:59 AM  Result Value Ref Range   WBC 16.8 (H) 4.0 - 10.5 K/uL   RBC 3.42 (L) 3.87 - 5.11 MIL/uL   Hemoglobin 10.4 (L) 12.0 - 15.0 g/dL   HCT 31.6 (L) 36.0 - 46.0 %   MCV 92.4 78.0 - 100.0 fL   MCH 30.4 26.0 - 34.0 pg   MCHC 32.9 30.0 - 36.0 g/dL   RDW 14.8 11.5 - 15.5 %   Platelets 332 150 - 400 K/uL   Neutrophils Relative % 67 %   Neutro Abs 11.3 (H) 1.7 - 7.7 K/uL   Lymphocytes Relative 28 %   Lymphs Abs 4.6 (H) 0.7 - 4.0 K/uL   Monocytes Relative 5 %   Monocytes Absolute 0.8 0.1 - 1.0 K/uL   Eosinophils  Relative 0 %   Eosinophils Absolute 0.0 0.0 - 0.7 K/uL   Basophils Relative 0 %   Basophils Absolute 0.0 0.0 - 0.1 K/uL  Basic metabolic panel     Status: Abnormal   Collection Time: 12/31/15  3:59 AM  Result Value Ref Range   Sodium 141 135 - 145 mmol/L   Potassium 4.0 3.5 - 5.1 mmol/L   Chloride 111 101 - 111 mmol/L   CO2 23 22 - 32 mmol/L   Glucose, Bld 99 65 - 99 mg/dL   BUN 17 6 - 20 mg/dL   Creatinine, Ser 1.56 (H) 0.44 - 1.00 mg/dL   Calcium 8.9 8.9 - 10.3 mg/dL   GFR calc non Af Amer 38 (L) >60 mL/min   GFR calc Af Amer 44 (L) >60 mL/min    Comment: (NOTE) The eGFR has been calculated using the CKD EPI equation. This calculation has not been validated in all clinical situations. eGFR's persistently <60 mL/min signify possible Chronic Kidney Disease.    Anion gap 7 5 - 15    MICRO: Synovial Fluid Cx: NGTD in bilateral knees UCx: NGTD  Assessment/Plan:   Amy Gibbs is a 50 yo female with PMHx of HTN, Anxiety, and Cocaine abuse who was admitted on 10/12 from [place] for bilateral knee pain and swelling and found to have an elevated ASO titer.   Poststreptococcal Reactive Arthritis: Given elevated ASO titer in the setting of recent URI and new bilateral knee pain, swelling and effusions, there is concern for Acute Rheumatic Fever. ASO titer elevated at 1551. Patient does have large joint arthritis; however, she states this is a chronic problem for her for the past 5-10 years (although worse this time). As far as other major criteria for ARF, patient does not have known pericarditis or valvulitis (although does have murmur on exam that she reports as chronic), no evidence of CNS involvements, subcutaneous nodules or erythema marginatum. As far as minor criteria, she does have elevated acute phase reactants (ESR and CRP);  however, these appear to be elevated in her chronically as seen in 2014. She has no evidence of fever or PR prolongation. Counting arthritis only  one, patient appears to meet one major and one minor criteria which does not satisfy the criteria to diagnose Acute Rheumatic Fever. Therefore, clinical picture more likely fits with poststreptococcal reactive arthritis. This is also supported by the shorter time period between infection and presentation of arthralgias (one week), severity of arthritis, and the presence of possible poststreptococcal glomerulonephritis (which is more commonly seen in PS reactive arthritis rather than ARF). Secondary prophylaxis to prevent valvular disease is usually only recommended in children. 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 Resolution is expected to begin one week after presentation. Our patient had no evidence of hematuria on UA.  -Complement pending -Continue to follow renal function -Continue supportive care  Martyn Malay, DO PGY-3 Internal Medicine Resident Pager # [phone] 12/31/2015 5:37 PM

## 2015-12-31 NOTE — Progress Notes (Cosign Needed)
CSW Intern spoke with patient regarding her concerns with applying for Medicaid and re-certification for her SNAP Benefits.  Patient stated that she had an appointment with DSS to re-certify for SNAP on 12/29/2015.  At that time, patient was admitted into the hospital and missed her appointment.  She was concerned that her benefits would stop on 01/04/2016 and wanted CSW to call her caseworker to explain her admission.  Advised patient that she would have to call her caseworker to inform them that she is still in the hospital.  Also advised patient that while she is at DSS for her SNAP Benefits re-cert, she could also apply for Medicaid.  Patient verbalized understanding and stated that she would call her caseworker today.  CSW Intern signing off as there are no other social work needs identified.

## 2016-01-01 ENCOUNTER — Inpatient Hospital Stay (HOSPITAL_COMMUNITY): Payer: Self-pay

## 2016-01-01 DIAGNOSIS — M009 Pyogenic arthritis, unspecified: Secondary | ICD-10-CM

## 2016-01-01 DIAGNOSIS — N009 Acute nephritic syndrome with unspecified morphologic changes: Secondary | ICD-10-CM

## 2016-01-01 DIAGNOSIS — N179 Acute kidney failure, unspecified: Secondary | ICD-10-CM

## 2016-01-01 DIAGNOSIS — R011 Cardiac murmur, unspecified: Secondary | ICD-10-CM

## 2016-01-01 DIAGNOSIS — I10 Essential (primary) hypertension: Secondary | ICD-10-CM

## 2016-01-01 DIAGNOSIS — F1410 Cocaine abuse, uncomplicated: Secondary | ICD-10-CM

## 2016-01-01 LAB — ECHOCARDIOGRAM COMPLETE
HEIGHTINCHES: 67 in
Weight: 2723.2 oz

## 2016-01-01 LAB — CULTURE, BODY FLUID W GRAM STAIN -BOTTLE: Culture: NO GROWTH

## 2016-01-01 LAB — CBC WITH DIFFERENTIAL/PLATELET
Basophils Absolute: 0 10*3/uL (ref 0.0–0.1)
Basophils Relative: 0 %
Eosinophils Absolute: 0 10*3/uL (ref 0.0–0.7)
Eosinophils Relative: 0 %
HEMATOCRIT: 31.5 % — AB (ref 36.0–46.0)
HEMOGLOBIN: 10.2 g/dL — AB (ref 12.0–15.0)
LYMPHS ABS: 5.2 10*3/uL — AB (ref 0.7–4.0)
Lymphocytes Relative: 28 %
MCH: 29.7 pg (ref 26.0–34.0)
MCHC: 32.4 g/dL (ref 30.0–36.0)
MCV: 91.6 fL (ref 78.0–100.0)
MONO ABS: 0.9 10*3/uL (ref 0.1–1.0)
MONOS PCT: 5 %
NEUTROS ABS: 12.5 10*3/uL — AB (ref 1.7–7.7)
NEUTROS PCT: 67 %
Platelets: 334 10*3/uL (ref 150–400)
RBC: 3.44 MIL/uL — ABNORMAL LOW (ref 3.87–5.11)
RDW: 15.1 % (ref 11.5–15.5)
WBC: 18.6 10*3/uL — ABNORMAL HIGH (ref 4.0–10.5)

## 2016-01-01 LAB — BASIC METABOLIC PANEL
Anion gap: 6 (ref 5–15)
BUN: 19 mg/dL (ref 6–20)
CHLORIDE: 109 mmol/L (ref 101–111)
CO2: 25 mmol/L (ref 22–32)
CREATININE: 1.58 mg/dL — AB (ref 0.44–1.00)
Calcium: 9.1 mg/dL (ref 8.9–10.3)
GFR calc Af Amer: 43 mL/min — ABNORMAL LOW (ref >60)
GFR calc non Af Amer: 37 mL/min — ABNORMAL LOW (ref >60)
GLUCOSE: 98 mg/dL (ref 65–99)
Potassium: 3.7 mmol/L (ref 3.5–5.1)
Sodium: 140 mmol/L (ref 135–145)

## 2016-01-01 LAB — CULTURE, BODY FLUID-BOTTLE: CULTURE: NO GROWTH

## 2016-01-01 LAB — PROTEIN / CREATININE RATIO, URINE
Creatinine, Urine: 79.34 mg/dL
PROTEIN CREATININE RATIO: 0.16 mg/mg{creat} — AB (ref 0.00–0.15)
TOTAL PROTEIN, URINE: 13 mg/dL

## 2016-01-01 LAB — COMPLEMENT, TOTAL

## 2016-01-01 LAB — C4 COMPLEMENT: Complement C4, Body Fluid: 35 mg/dL (ref 14–44)

## 2016-01-01 LAB — C3 COMPLEMENT: C3 Complement: 134 mg/dL (ref 82–167)

## 2016-01-01 MED ORDER — OXYCODONE HCL 5 MG PO TABS
10.0000 mg | ORAL_TABLET | ORAL | Status: DC | PRN
  Administered 2016-01-01 – 2016-01-02 (×6): 10 mg via ORAL
  Filled 2016-01-01 (×6): qty 2

## 2016-01-01 MED ORDER — HYDROMORPHONE HCL 1 MG/ML IJ SOLN: 1.0000 mg | INTRAMUSCULAR | Status: DC | PRN

## 2016-01-01 NOTE — Progress Notes (Signed)
    [facility]    Date of Admission:  12/26/2015      ID: Amy Gibbs is a 50 y.o. female with PMHx of HTN, Anxiety, and Cocaine abuse who was admitted on 10/12 from [facility] for bilateral knee pain and swelling with viral prodrome consistent with poststreptococcal reactive arthritis.  Principal Problem:   Poststreptococcal arthritis of multiple sites [facility](H[facility]) Active Problems:   Hypertension   Cocaine abuse   Anxiety   Acute kidney injury ([facility])   Trichomoniasis  Subjective: Patient was seen and examined this morning. She continues to have bilateral knee pain. She denies fever, chills, dysphagia, cough, chest pain.   Medications:  . acetaminophen  650 mg Oral Q6H  . amLODipine  10 mg Oral Daily  . enoxaparin (LOVENOX) injection  40 mg Subcutaneous Q24H  . ibuprofen  800 mg Oral QID  . metroNIDAZOLE  500 mg Oral Q12H  . polyethylene glycol  17 g Oral Daily    Objective: Vitals:   12/31/15 1906 12/31/15 2133 01/01/16 0620 01/01/16 1000  BP: (!) 141/86 120/70 (!) 146/92 (!) 157/93  Pulse:  66 (!) 56 64  Resp:  18 16 18   Temp:  97.9 F (36.6 C) 98.5 F (36.9 C) 98.4 F (36.9 C)  TempSrc:  Oral Oral Oral  SpO2:  98% 100% 100%  Weight:  170 lb 3.2 oz (77.2 kg)    Height:  5\' 7"  (1.702 m)     General: Vital signs reviewed.  Patient is well-developed and well-nourished, in no acute distress and cooperative with exam.  Mouth: +White lesions on posterior oropharynx.  Cardiovascular: RRR, 2/6 systolic murmur. Pulmonary/Chest: Clear to auscultation bilaterally, no wheezes, rales, or rhonchi. Musculoskeletal: Mild effusion and edema noted of bilateral knees and mildly tender to palpation with increased warmth.  Skin: Warm, dry and intact. No rashes or erythema.  Lab Results  Recent Labs  12/31/15 0359 01/01/16 0809  WBC 16.8* 18.6*  HGB 10.4* 10.2*  HCT 31.6* 31.5*  NA 141 140  K 4.0 3.7  CL 111 109  CO2 23 25  BUN 17 19    CREATININE 1.56* 1.58*   Microbiology: Synovial Fluid Cx: NGTD in bilateral knees UCx: NGTD Viral Culture: Pending  Assessment/Plan: Amy Gibbs is a 50 yo female with PMHx of HTN, Anxiety, and Cocaine abuse who was admitted on 10/12 from [facility] for bilateral knee pain and swelling and found to have an elevated ASO titer.   Poststreptococcal Reactive Arthritis: Clinical picture fits with poststreptococcal reactive arthritis. This is also supported by the shorter time period between infection and presentation of arthralgias, severity of arthritis, and the presence of possible poststreptococcal glomerulonephritis. Anti-DNAse B antibody is also positive.  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 However, complement was normal. Baseline creatinine in 2007 was 0.69. Creatinine has trended 1.98>1.58. -Continue to follow renal function -Continue supportive care -Protein/Creatinine ratio pending  Herpetic Appearing Lesions on Posterior Oropharynx:  -Viral and HSV Cultures obtained and sent for processing  Karlene LinemanAlexa Burns, DO PGY-3 Internal Medicine Resident Pager # [phone] 01/01/2016 11:51 AM

## 2016-01-01 NOTE — Progress Notes (Signed)
  Echocardiogram 2D Echocardiogram has been performed.  Amy Gibbs 01/01/2016, 10:46 AM

## 2016-01-01 NOTE — Progress Notes (Signed)
PROGRESS NOTE        PATIENT DETAILS Name: Amy Gibbs Age: 50 y.o. Sex: female Date of Birth: Sep 26, 1965 Admit Date: 12/26/2015 Admitting Physician Bobette Mo, MD PCP:No PCP Per Patient  Brief Narrative: Patient is a 50 y.o. female chronic pain syndrome-due to chronic back pain/arthritis, history of polysubstance abuse who presented to the ED with bilateral knee pain and swelling. Initially thought to have septic arthritis and started on empiric antibiotics, underwent arthrocentesis-synovial fluid consistent with inflammation rather than infection. 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  Subjective: Bilateral knee pain is much better. Ambulating in the room this morning with a walker.  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 Autoimmune serology negative so far. Given mild kidney injury-unable to use nonsteroidal anti-inflammatory medications, may still continues to have significant arthralgia's-may need to start low-dose prednisone. Minimize IV narcotics-stopped Dilaudid-start oral narcotics.  Leukocytosis: Probably secondary to ongoing inflammation from a reactive arthritis, synovitis fluid cultures negative. Continue to monitor off antibiotics.  Trichomoniasis: Continue Flagyl  Probable acute kidney injury: Suspect more of a hemodynamic mediated injury rather post streptococcal glomerulonephritis. Very minimal proteinuria, complement levels normal. Avoid nephrotoxic agents, follow.  Hypertension: Controlled with amlodipine, follow  History of polysubstance use: Urine drug screen positive for cocaine and benzodiazepines-counseled  Chronic pain syndrome: Per patient she previously used to see a pain management M.D.-but due to financial issues she no longer follows with pain management. She has chronic back pain and chronic  arthralgias at baseline. Difficult situation, with ongoing flare of reactive arthritis-needs some amount of narcotics to control her pain. We will try and minimize narcotics as much as possible, today we will discontinue IV narcotics.  DVT Prophylaxis: Prophylactic Lovenox   Code Status: Full code   Family Communication: None at bedside  Disposition Plan: Remain inpatient  Antimicrobial agents: See below  Procedures: Arthrocentesis 10/13>> Echo 10/18>>EF 65-70  CONSULTS:  ID and orthopedic surgery  Time spent: 25 minutes-Greater than 50% of this time was spent in counseling, explanation of diagnosis, planning of further management, and coordination of care.  MEDICATIONS: Anti-infectives    Start     Dose/Rate Route Frequency Ordered Stop   12/29/15 1330  metroNIDAZOLE (FLAGYL) tablet 500 mg     500 mg Oral Every 12 hours 12/29/15 1328 01/05/16 0959   12/27/15 2200  vancomycin (VANCOCIN) IVPB 1000 mg/200 mL premix  Status:  Discontinued     1,000 mg 200 mL/hr over 60 Minutes Intravenous Every 24 hours 12/27/15 0101 12/30/15 1248   12/27/15 2200  ceFEPIme (MAXIPIME) 2 g in dextrose 5 % 50 mL IVPB  Status:  Discontinued     2 g 100 mL/hr over 30 Minutes Intravenous Every 24 hours 12/27/15 0101 12/28/15 1331   12/27/15 0016  ceFEPIme (MAXIPIME) 2 g injection    Comments:  Waldo Laine   : cabinet override      12/27/15 0016 12/27/15 1229   12/27/15 0015  ceFEPIme (MAXIPIME) 2 g in dextrose 5 % 50 mL IVPB     2 g 100 mL/hr over 30 Minutes Intravenous  Once 12/27/15 0011 12/27/15 0048   12/27/15 0015  vancomycin (VANCOCIN) IVPB 1000 mg/200 mL premix     1,000 mg 200 mL/hr over 60 Minutes Intravenous  Once 12/27/15 0011  12/27/15 0152   12/27/15 0000  vancomycin (VANCOCIN) IVPB 1000 mg/200 mL premix  Status:  Discontinued     1,000 mg 200 mL/hr over 60 Minutes Intravenous  Once 12/26/15 2348 12/26/15 2349   12/27/15 0000  ceFEPIme (MAXIPIME) 2 g in dextrose 5 % 50 mL IVPB   Status:  Discontinued     2 g 100 mL/hr over 30 Minutes Intravenous Every 8 hours 12/26/15 2348 12/26/15 2349      Scheduled Meds: . acetaminophen  650 mg Oral Q6H  . amLODipine  10 mg Oral Daily  . enoxaparin (LOVENOX) injection  40 mg Subcutaneous Q24H  . ibuprofen  800 mg Oral QID  . metroNIDAZOLE  500 mg Oral Q12H  . polyethylene glycol  17 g Oral Daily   Continuous Infusions:  PRN Meds:.hydrALAZINE, HYDROmorphone (DILAUDID) injection, ondansetron **OR** ondansetron (ZOFRAN) IV, oxyCODONE, senna-docusate   PHYSICAL EXAM: Vital signs: Vitals:   12/31/15 1906 12/31/15 2133 01/01/16 0620 01/01/16 1000  BP: (!) 141/86 120/70 (!) 146/92 (!) 157/93  Pulse:  66 (!) 56 64  Resp:  18 16 18   Temp:  97.9 F (36.6 C) 98.5 F (36.9 C) 98.4 F (36.9 C)  TempSrc:  Oral Oral Oral  SpO2:  98% 100% 100%  Weight:  77.2 kg (170 lb 3.2 oz)    Height:  5\' 7"  (1.702 m)     Filed Weights   12/27/15 2033 12/30/15 2207 12/31/15 2133  Weight: 72.5 kg (159 lb 13.3 oz) 78.1 kg (172 lb 3.2 oz) 77.2 kg (170 lb 3.2 oz)   Body mass index is 26.66 kg/m.   General appearance :Awake, alert, not in any distress. Speech Clear. Not toxic Looking Eyes:, pupils equally reactive to light and accomodation,no scleral icterus.Pink conjunctiva HEENT: Atraumatic and Normocephalic Neck: supple, no JVD. No cervical lymphadenopathy. No thyromegaly Resp:Good air entry bilaterally, no added sounds  CVS: S1 S2 regular, no murmurs.  GI: Bowel sounds present, Non tender and not distended with no gaurding, rigidity or rebound.No organomegaly Extremities: B/L Lower Ext shows no edema, both legs are warm to touch. B/L knee still with mild effusion-with mild tenderness Neurology:  speech clear,Non focal, sensation is grossly intact. Psychiatric: Normal judgment and insight. Alert and oriented x 3. Normal mood. Musculoskeletal:No digital cyanosis Skin:No Rash, warm and dry Wounds:N/A  I have personally reviewed  following labs and imaging studies  LABORATORY DATA: CBC:  Recent Labs Lab 12/26/15 2050 12/28/15 0602 12/29/15 0521 12/30/15 0742 12/31/15 0359 01/01/16 0809  WBC 13.0* 11.2* 9.7 7.5 16.8* 18.6*  NEUTROABS 8.4*  --  4.7 3.7 11.3* 12.5*  HGB 12.2 11.0* 10.6* 10.8* 10.4* 10.2*  HCT 36.9 33.7* 33.1* 33.7* 31.6* 31.5*  MCV 92.9 93.9 94.0 93.9 92.4 91.6  PLT 331 317 324 350 332 334    Basic Metabolic Panel:  Recent Labs Lab 12/28/15 0602 12/29/15 0521 12/30/15 0742 12/31/15 0359 01/01/16 0809  NA 141 140 142 141 140  K 3.7 3.6 3.9 4.0 3.7  CL 109 110 112* 111 109  CO2 24 23 26 23 25   GLUCOSE 117* 139* 104* 99 98  BUN 20 23* 16 17 19   CREATININE 1.69* 1.64* 1.59* 1.56* 1.58*  CALCIUM 9.0 8.6* 9.0 8.9 9.1    GFR: Estimated Creatinine Clearance: 46.1 mL/min (by C-G formula based on SCr of 1.58 mg/dL (H)).  Liver Function Tests: No results for input(s): AST, ALT, ALKPHOS, BILITOT, PROT, ALBUMIN in the last 168 hours. No results for input(s): LIPASE, AMYLASE in the  last 168 hours. No results for input(s): AMMONIA in the last 168 hours.  Coagulation Profile: No results for input(s): INR, PROTIME in the last 168 hours.  Cardiac Enzymes: No results for input(s): CKTOTAL, CKMB, CKMBINDEX, TROPONINI in the last 168 hours.  BNP (last 3 results) No results for input(s): PROBNP in the last 8760 hours.  HbA1C: No results for input(s): HGBA1C in the last 72 hours.  CBG: No results for input(s): GLUCAP in the last 168 hours.  Lipid Profile: No results for input(s): CHOL, HDL, LDLCALC, TRIG, CHOLHDL, LDLDIRECT in the last 72 hours.  Thyroid Function Tests: No results for input(s): TSH, T4TOTAL, FREET4, T3FREE, THYROIDAB in the last 72 hours.  Anemia Panel: No results for input(s): VITAMINB12, FOLATE, FERRITIN, TIBC, IRON, RETICCTPCT in the last 72 hours.  Urine analysis:    Component Value Date/Time   COLORURINE YELLOW 12/27/2015 0130   APPEARANCEUR CLEAR  12/27/2015 0130   LABSPEC 1.010 12/27/2015 0130   PHURINE 6.5 12/27/2015 0130   GLUCOSEU 100 (A) 12/27/2015 0130   HGBUR NEGATIVE 12/27/2015 0130   BILIRUBINUR NEGATIVE 12/27/2015 0130   KETONESUR NEGATIVE 12/27/2015 0130   PROTEINUR 30 (A) 12/27/2015 0130   NITRITE NEGATIVE 12/27/2015 0130   LEUKOCYTESUR NEGATIVE 12/27/2015 0130    Sepsis Labs: Lactic Acid, Venous No results found for: LATICACIDVEN  MICROBIOLOGY: Recent Results (from the past 240 hour(s))  Culture, body fluid-bottle     Status: None   Collection Time: 12/27/15  4:00 PM  Result Value Ref Range Status   Specimen Description SYNOVIAL LEFT KNEE  Final   Special Requests BOTTLES DRAWN AEROBIC AND ANAEROBIC 10CC  Final   Culture NO GROWTH 5 DAYS  Final   Report Status 01/01/2016 FINAL  Final  Gram stain     Status: None   Collection Time: 12/27/15  4:00 PM  Result Value Ref Range Status   Specimen Description SYNOVIAL LEFT KNEE  Final   Special Requests NONE  Final   Gram Stain   Final    ABUNDANT WBC PRESENT, PREDOMINANTLY PMN NO ORGANISMS SEEN    Report Status 12/27/2015 FINAL  Final  Culture, body fluid-bottle     Status: None   Collection Time: 12/27/15  4:00 PM  Result Value Ref Range Status   Specimen Description SYNOVIAL RIGHT KNEE  Final   Special Requests BOTTLES DRAWN AEROBIC AND ANAEROBIC 10CC  Final   Culture NO GROWTH 5 DAYS  Final   Report Status 01/01/2016 FINAL  Final  Gram stain     Status: None   Collection Time: 12/27/15  4:00 PM  Result Value Ref Range Status   Specimen Description SYNOVIAL RIGHT KNEE  Final   Special Requests NONE  Final   Gram Stain   Final    ABUNDANT WBC PRESENT, PREDOMINANTLY PMN NO ORGANISMS SEEN    Report Status 12/27/2015 FINAL  Final  Culture, Urine     Status: None   Collection Time: 12/29/15  1:44 PM  Result Value Ref Range Status   Specimen Description URINE, RANDOM  Final   Special Requests NONE  Final   Culture NO GROWTH  Final   Report Status  12/30/2015 FINAL  Final    RADIOLOGY STUDIES/RESULTS: Dg Chest 2 View  Result Date: 12/27/2015 CLINICAL DATA:  Wheezing.  Chest pain and cough. EXAM: CHEST  2 VIEW COMPARISON:  None. FINDINGS: Lung volumes are low. Prominence of the cardiac silhouette likely accentuated by low lung volumes. There is tortuosity of the thoracic aorta. No  focal airspace disease, pulmonary edema or pleural fluid. No pneumothorax. No acute osseous abnormality is seen. IMPRESSION: Hypoventilatory chest. Prominent cardiac silhouette is likely accentuated by low lung volumes. Electronically Signed   By: Rubye Oaks M.D.   On: 12/27/2015 00:17   US Renal  Result Date: 12/27/2015 CLINICAL DATA:  Acute renal disease. EXAM: RENAL / URINARY TRACT ULTRASOUND COMPLETE COMPARISON:  None. FINDINGS: Right Kidney: Length: 9.3 cm. Echogenicity within normal limits. No mass or hydronephrosis visualized. Left Kidney: Length: 9.8 cm. Echogenicity within normal limits. No mass or hydronephrosis visualized. Bladder: Appears normal for degree of bladder distention. IMPRESSION: No cause for acute renal insufficiency identified. Electronically Signed   By: Gerome Sam III M.D   On: 12/27/2015 19:01   Dg Knee Complete 4 Views Left  Result Date: 12/26/2015 CLINICAL DATA:  Left knee pain and swelling.  Acute on chronic pain. EXAM: LEFT KNEE - COMPLETE 4+ VIEW COMPARISON:  None. FINDINGS: Mild tricompartmental osteoarthritis with peripheral spurring. No acute fracture or subluxation. Fragmentation of the anterior tibial tubercle. No bony destructive change. There is a large knee joint effusion. IMPRESSION: Large knee joint effusion with mild tricompartmental osteoarthritis. No acute bony abnormality. Electronically Signed   By: Rubye Oaks M.D.   On: 12/26/2015 22:29   Dg Knee Complete 4 Views Right  Result Date: 12/26/2015 CLINICAL DATA:  History of arthritis, chronic knee pain EXAM: RIGHT KNEE - COMPLETE 4+ VIEW COMPARISON:   None. FINDINGS: There is mild narrowing and spurring of the medial compartment. No acute fracture or dislocation. Large suprapatellar joint effusion. IMPRESSION: 1. No acute fracture. 2. Large suprapatellar joint effusion. Electronically Signed   By: Jasmine Pang M.D.   On: 12/26/2015 22:28     LOS: 5 days   Jeoffrey Massed, MD  [facility] Pager:[phone]  If 7PM-7AM, please contact night-coverage [email or web address] Password [email or web address] 01/01/2016, 4:58 PM

## 2016-01-01 NOTE — Progress Notes (Signed)
   01/01/16 0900  Clinical Encounter Type  Visited With Patient not available  Visit Type Initial  Referral From Nurse    Chaplain attempted to visit with patient twice in am. Pt not in room. Send new consult if pt still requests prayer.

## 2016-01-02 ENCOUNTER — Inpatient Hospital Stay (HOSPITAL_COMMUNITY): Payer: Self-pay

## 2016-01-02 LAB — CBC WITH DIFFERENTIAL/PLATELET
BASOS PCT: 0 %
Basophils Absolute: 0 10*3/uL (ref 0.0–0.1)
EOS PCT: 1 %
Eosinophils Absolute: 0.1 10*3/uL (ref 0.0–0.7)
HCT: 29.4 % — ABNORMAL LOW (ref 36.0–46.0)
HEMOGLOBIN: 9.6 g/dL — AB (ref 12.0–15.0)
LYMPHS PCT: 50 %
Lymphs Abs: 5.2 10*3/uL — ABNORMAL HIGH (ref 0.7–4.0)
MCH: 30.2 pg (ref 26.0–34.0)
MCHC: 32.7 g/dL (ref 30.0–36.0)
MCV: 92.5 fL (ref 78.0–100.0)
MONO ABS: 0.5 10*3/uL (ref 0.1–1.0)
Monocytes Relative: 5 %
NEUTROS ABS: 4.6 10*3/uL (ref 1.7–7.7)
NEUTROS PCT: 44 %
Platelets: 311 10*3/uL (ref 150–400)
RBC: 3.18 MIL/uL — ABNORMAL LOW (ref 3.87–5.11)
RDW: 15.3 % (ref 11.5–15.5)
WBC: 10.4 10*3/uL (ref 4.0–10.5)

## 2016-01-02 LAB — BASIC METABOLIC PANEL
Anion gap: 5 (ref 5–15)
BUN: 18 mg/dL (ref 6–20)
CHLORIDE: 108 mmol/L (ref 101–111)
CO2: 28 mmol/L (ref 22–32)
Calcium: 8.6 mg/dL — ABNORMAL LOW (ref 8.9–10.3)
Creatinine, Ser: 1.6 mg/dL — ABNORMAL HIGH (ref 0.44–1.00)
GFR calc Af Amer: 43 mL/min — ABNORMAL LOW (ref >60)
GFR calc non Af Amer: 37 mL/min — ABNORMAL LOW (ref >60)
GLUCOSE: 88 mg/dL (ref 65–99)
POTASSIUM: 4.4 mmol/L (ref 3.5–5.1)
Sodium: 141 mmol/L (ref 135–145)

## 2016-01-02 LAB — HLA-B27 ANTIGEN: HLA-B27: NEGATIVE

## 2016-01-02 MED ORDER — AMLODIPINE BESYLATE 10 MG PO TABS: 10.0000 mg | ORAL_TABLET | Freq: Every day | ORAL | 0 refills | Status: DC

## 2016-01-02 MED ORDER — METRONIDAZOLE 500 MG PO TABS: 500.0000 mg | ORAL_TABLET | Freq: Two times a day (BID) | ORAL | 0 refills | Status: DC

## 2016-01-02 MED ORDER — PREDNISONE 20 MG PO TABS
20.0000 mg | ORAL_TABLET | Freq: Every day | ORAL | Status: DC
  Administered 2016-01-02: 20 mg via ORAL
  Filled 2016-01-02: qty 1

## 2016-01-02 MED ORDER — PREDNISONE 20 MG PO TABS: 20.0000 mg | ORAL_TABLET | Freq: Every day | ORAL | 0 refills | Status: DC

## 2016-01-02 NOTE — Care Management Note (Signed)
Case Management Note  Patient Details  Name: Amy Gibbs MRN: [id number] Date of Birth: 07/18/1965  Subjective/Objective:       CM following for progression and d/c planning.              Action/Plan: 01/02/2016 Following this pt since admission, noted HH order for HHPT, however this pt is selfpay and does not qualify for HHPT as a charity case.  This pt is able to ambulate with her walker which [facility] along with a 3:1 commode. Pt walking the full length of the hall multiple times a day using walker without difficulty. She is also able to get in and out of bed and go to the bathroom without assistance.  Pt also able to leave the nursing unit without assistance. Match letter given and explained to pt and [facility] [place]. This information was placed in the pt d/c instructions and reviewed with they pt by this CM and her RN upon d/c.   Expected Discharge Date:  01/02/2016             Expected Discharge Plan:  Home/Self Care  In-House Referral:     Discharge planning Services  [facility]  Post Acute Care Choice:  Durable Medical Equipment Choice offered to:  Patient  DME Arranged:  3-N-1, Walker rolling DME Agency:  [facility]  [facility]     Status of Service:  Completed, signed off  If discussed at Long Length of Stay Meetings, dates discussed:    Additional Comments:  Starlyn SkeansRoyal, Yareni Creps U, RN 01/02/2016, 12:25 PM

## 2016-01-02 NOTE — Discharge Summary (Signed)
PATIENT DETAILS Name: Amy Gibbs Age: 50 y.o. Sex: female Date of Birth: [birth year] MRN: [id number]. Admitting Physician: Bobette Mo, MD PCP:No PCP Per Patient  Admit Date: 12/26/2015 Discharge date: 01/02/2016  Recommendations for Outpatient Follow-up:  1. Follow up with PCP in 1-2 weeks 2. Please obtain BMP/CBC in one week 3. Started on prednisone-will need to be tapered off over the next few weeks. 4. Will need counseling regarding importance of stopping cocaine use 5. Virus culture from oral lesion still pending-please follow  Admitted From:  Home   Disposition: Home   Home Health: Yes  Equipment/Devices: None  Discharge Condition: Stable  CODE STATUS: FULL CODE  Diet recommendation:  Heart Healthy  Brief Summary: See H&P, Labs, Consult and Test reports for all details in brief,Patient is a 50 y.o. female chronic pain syndrome-due to chronic back pain/arthritis, history of polysubstance abuse who presented to the ED with bilateral knee pain and swelling. Initially thought to have septic arthritis and started on empiric antibiotics, underwent arthrocentesis-synovial fluid consistent with inflammation rather than infection. 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  Brief Hospital Course: Reactive arthritis: Thought to be secondary to reactive arthritis post streptococcal infection (recent history of sore throat with elevated ASO titer). Initially was on empiric antibiotics to cover septic arthritis, underwent arthrocentesis-synovial fluid analysis was more consistent with inflammatory rather than an infectious etiology. Hence all antibiotics was discontinued. ID and orthopedics were consulted during this hospital stay. Autoimmune serology negative so far. Given mild kidney injury-unable to use nonsteroidal anti-inflammatory medications, hence started on low-dose steroids for anti-inflammatory effect-this will need to be tapered off over the next  few weeks by her PCP. Patient has a long-standing history of chronic arthralgias in her knees-and previously was seeing a pain management M.D-however due to financial issues she no longer is following with pain management at this time. She claims that she uses some of her mother's narcotics, unfortunately she continues to use cocaine as well. She was counseled regarding the harmful effects of polypharmacy. I have asked her to establish herself with a PCP and see if she can get established with a pain management M.D. She  will not be given any narcotics on discharge as her pain seems to be more of a chronic issue (acute pain has significantly improved) at this time. Furthermore, she has a history of cocaine use-and her drug screen on admission was positive for both cocaine and opiates.   Please note, on day of discharge, patient was able to ambulate from a bed to the bathroom without the use of a walker. Her arthralgias have improved, there is very minimal effusion in her bilateral knees. No overlying erythema or significant tenderness  in her bilateral knees.  Leukocytosis:  resolved-Probably secondary to ongoing inflammation from a reactive arthritis, synovitis fluid cultures negative. Continue to monitor off antibiotics.  Trichomoniasis: Continue Flagyl for a total of 7 days, I have advised patient to notify her partner.  Probable acute kidney injury vs CKD stage 2: Suspect more of a hemodynamic mediated injury rather post streptococcal glomerulonephritis. Very minimal proteinuria, complement levels normal. She also could have developed CKD due to hypertensive nephrosclerosis (noncompliant to her antihypertensives). Renal ultrasound negative for hydronephrosis. Avoid nephrotoxic agents, follow renal function closely in the outpatient setting and refer to nephrology if able.  Hypertension: Controlled with amlodipine. Note-she was previously noncompliant to antihypertensives  History of  polysubstance use: Urine drug screen positive for cocaine and opiates  Chronic pain syndrome: Per  patient she previously used to see a pain management M.D.-but due to financial issues she no longer follows with pain management. She has chronic back pain and chronic arthralgias in her lower extremity at baseline. Given her issues with noncompliance to medications-noncompliance to follow-up with pain management-ongoing cocaine use-best served by establishing with either PCP or pain management M.D. if narcotic therapy felt to be required in the future.  Procedures/Studies: Arthrocentesis 2-D echo 10/18>> EF 65-70%, grade 1 diastolic dysfunction  Discharge Diagnoses:  Principal Problem:   Poststreptococcal arthritis of multiple sites [facility](HCC) Active Problems:   Hypertension   Cocaine abuse   Anxiety   AKI (acute kidney injury) (HCC)   Trichomoniasis   Discharge Instructions:  Activity:  As tolerated with Full fall precautions use walker/cane & assistance as needed  Discharge Instructions    Diet - low sodium heart healthy    Complete by:  As directed    Discharge instructions    Complete by:  As directed    Follow with Primary MD    Please get a complete blood count and chemistry panel checked by your Primary MD at your next visit, and again as instructed by your Primary MD.  Get Medicines reviewed and adjusted: Please take all your medications with you for your next visit with your Primary MD  Laboratory/radiological data: Please request your Primary MD to go over all hospital tests and procedure/radiological results at the follow up, please ask your Primary MD to get all Hospital records sent to his/her office.  In some cases, they will be blood work, cultures and biopsy results pending at the time of your discharge. Please request that your primary care M.D. follows up on these results.  Also Note the following: If you experience worsening of your admission symptoms, develop  shortness of breath, life threatening emergency, suicidal or homicidal thoughts you must seek medical attention immediately by calling 911 or calling your MD immediately  if symptoms less severe.  You must read complete instructions/literature along with all the possible adverse reactions/side effects for all the Medicines you take and that have been prescribed to you. Take any new Medicines after you have completely understood and accpet all the possible adverse reactions/side effects.   Do not drive when taking Pain medications or sleeping medications (Benzodaizepines)  Do not take more than prescribed Pain, Sleep and Anxiety Medications. It is not advisable to combine anxiety,sleep and pain medications without talking with your primary care practitioner  Special Instructions: If you have smoked or chewed Tobacco  in the last 2 yrs please stop smoking, stop any regular Alcohol  and or any Recreational drug use.  Wear Seat belts while driving.  Please note: You were cared for by a hospitalist during your hospital stay. Once you are discharged, your primary care physician will handle any further medical issues. Please note that NO REFILLS for any discharge medications will be authorized once you are discharged, as it is imperative that you return to your primary care physician (or establish a relationship with a primary care physician if you do not have one) for your post hospital discharge needs so that they can reassess your need for medications and monitor your lab values.   Increase activity slowly    Complete by:  As directed        Medication List    TAKE these medications   amLODipine 10 MG tablet Commonly known as:  NORVASC Take 1 tablet (10 mg total) by mouth daily. Start taking  on:  01/03/2016   metroNIDAZOLE 500 MG tablet Commonly known as:  FLAGYL Take 1 tablet (500 mg total) by mouth every 12 (twelve) hours.   predniSONE 20 MG tablet Commonly known as:  DELTASONE Take 1  tablet (20 mg total) by mouth daily with breakfast. Start taking on:  01/03/2016      Follow-up Information    [facility] .   Why:  Hospital followup appointment, Thursday, [facility] followup appointment, Thursday, January 09, 2016 at 9:30 am. Please call if you are unable to keep this appointment.   Contact information: [address] [place] [address] [phone]         No Known Allergies  Consultations:   ID and orthopedic surgery  Other Procedures/Studies: Dg Chest 2 View  Result Date: 12/27/2015 CLINICAL DATA:  Wheezing.  Chest pain and cough. EXAM: CHEST  2 VIEW COMPARISON:  None. FINDINGS: Lung volumes are low. Prominence of the cardiac silhouette likely accentuated by low lung volumes. There is tortuosity of the thoracic aorta. No focal airspace disease, pulmonary edema or pleural fluid. No pneumothorax. No acute osseous abnormality is seen. IMPRESSION: Hypoventilatory chest. Prominent cardiac silhouette is likely accentuated by low lung volumes. Electronically Signed   By: Rubye Oaks M.D.   On: 12/27/2015 00:17   US Renal  Result Date: 01/02/2016 CLINICAL DATA:  Acute renal failure, hypertension, drug abuse EXAM: RENAL / URINARY TRACT ULTRASOUND COMPLETE COMPARISON:  Ultrasound of the kidneys of 12/27/2015 FINDINGS: Right Kidney: Length: 9.3 cm. No hydronephrosis is seen. The ultrasound technologist questioned minimal fullness of the right pelvocaliceal system. Left Kidney: Length: 10.0 cm.  No hydronephrosis is seen. Bladder: The urinary bladder is unremarkable. IMPRESSION: No hydronephrosis . Electronically Signed   By: Dwyane Dee M.D.   On: 01/02/2016 09:14   US Renal  Result Date: 12/27/2015 CLINICAL DATA:  Acute renal disease. EXAM: RENAL / URINARY TRACT ULTRASOUND COMPLETE COMPARISON:  None. FINDINGS: Right Kidney: Length: 9.3 cm. Echogenicity within normal limits. No mass or  hydronephrosis visualized. Left Kidney: Length: 9.8 cm. Echogenicity within normal limits. No mass or hydronephrosis visualized. Bladder: Appears normal for degree of bladder distention. IMPRESSION: No cause for acute renal insufficiency identified. Electronically Signed   By: Gerome Sam III M.D   On: 12/27/2015 19:01   Dg Knee Complete 4 Views Left  Result Date: 12/26/2015 CLINICAL DATA:  Left knee pain and swelling.  Acute on chronic pain. EXAM: LEFT KNEE - COMPLETE 4+ VIEW COMPARISON:  None. FINDINGS: Mild tricompartmental osteoarthritis with peripheral spurring. No acute fracture or subluxation. Fragmentation of the anterior tibial tubercle. No bony destructive change. There is a large knee joint effusion. IMPRESSION: Large knee joint effusion with mild tricompartmental osteoarthritis. No acute bony abnormality. Electronically Signed   By: Rubye Oaks M.D.   On: 12/26/2015 22:29   Dg Knee Complete 4 Views Right  Result Date: 12/26/2015 CLINICAL DATA:  History of arthritis, chronic knee pain EXAM: RIGHT KNEE - COMPLETE 4+ VIEW COMPARISON:  None. FINDINGS: There is mild narrowing and spurring of the medial compartment. No acute fracture or dislocation. Large suprapatellar joint effusion. IMPRESSION: 1. No acute fracture. 2. Large suprapatellar joint effusion. Electronically Signed   By: Jasmine Pang M.D.   On: 12/26/2015 22:28      TODAY-DAY OF DISCHARGE:  Subjective:   Rockey Situ today has no headache,no chest abdominal pain,no new weakness tingling or numbness, feels much better wants to go  home today.   Objective:   Blood pressure 134/87, pulse 68, temperature 98 F (36.7 C), temperature source Oral, resp. rate 18, height 5\' 7"  (1.702 m), weight 79.4 kg (175 lb 1.6 oz), SpO2 98 %.  Intake/Output Summary (Last 24 hours) at 01/02/16 1117 Last data filed at 01/02/16 0900  Gross per 24 hour  Intake              880 ml  Output                0 ml  Net              880  ml   Filed Weights   12/31/15 2133 01/01/16 2041 01/02/16 0500  Weight: 77.2 kg (170 lb 3.2 oz) 74.4 kg (164 lb) 79.4 kg (175 lb 1.6 oz)    Exam: Awake Alert, Oriented *3, No new F.N deficits, Normal affect [place].AT,PERRAL Supple Neck,No JVD, No cervical lymphadenopathy appriciated.  Symmetrical Chest wall movement, Good air movement bilaterally, CTAB RRR,No Gallops,Rubs or new Murmurs, No Parasternal Heave +ve B.Sounds, Abd Soft, Non tender, No organomegaly appriciated, No rebound -guarding or rigidity. No Cyanosis, Clubbing or edema, No new Rash or bruise   PERTINENT RADIOLOGIC STUDIES: Dg Chest 2 View  Result Date: 12/27/2015 CLINICAL DATA:  Wheezing.  Chest pain and cough. EXAM: CHEST  2 VIEW COMPARISON:  None. FINDINGS: Lung volumes are low. Prominence of the cardiac silhouette likely accentuated by low lung volumes. There is tortuosity of the thoracic aorta. No focal airspace disease, pulmonary edema or pleural fluid. No pneumothorax. No acute osseous abnormality is seen. IMPRESSION: Hypoventilatory chest. Prominent cardiac silhouette is likely accentuated by low lung volumes. Electronically Signed   By: Rubye Oaks M.D.   On: 12/27/2015 00:17   US Renal  Result Date: 01/02/2016 CLINICAL DATA:  Acute renal failure, hypertension, drug abuse EXAM: RENAL / URINARY TRACT ULTRASOUND COMPLETE COMPARISON:  Ultrasound of the kidneys of 12/27/2015 FINDINGS: Right Kidney: Length: 9.3 cm. No hydronephrosis is seen. The ultrasound technologist questioned minimal fullness of the right pelvocaliceal system. Left Kidney: Length: 10.0 cm.  No hydronephrosis is seen. Bladder: The urinary bladder is unremarkable. IMPRESSION: No hydronephrosis . Electronically Signed   By: Dwyane Dee M.D.   On: 01/02/2016 09:14   US Renal  Result Date: 12/27/2015 CLINICAL DATA:  Acute renal disease. EXAM: RENAL / URINARY TRACT ULTRASOUND COMPLETE COMPARISON:  None. FINDINGS: Right Kidney: Length: 9.3 cm.  Echogenicity within normal limits. No mass or hydronephrosis visualized. Left Kidney: Length: 9.8 cm. Echogenicity within normal limits. No mass or hydronephrosis visualized. Bladder: Appears normal for degree of bladder distention. IMPRESSION: No cause for acute renal insufficiency identified. Electronically Signed   By: Gerome Sam III M.D   On: 12/27/2015 19:01   Dg Knee Complete 4 Views Left  Result Date: 12/26/2015 CLINICAL DATA:  Left knee pain and swelling.  Acute on chronic pain. EXAM: LEFT KNEE - COMPLETE 4+ VIEW COMPARISON:  None. FINDINGS: Mild tricompartmental osteoarthritis with peripheral spurring. No acute fracture or subluxation. Fragmentation of the anterior tibial tubercle. No bony destructive change. There is a large knee joint effusion. IMPRESSION: Large knee joint effusion with mild tricompartmental osteoarthritis. No acute bony abnormality. Electronically Signed   By: Rubye Oaks M.D.   On: 12/26/2015 22:29   Dg Knee Complete 4 Views Right  Result Date: 12/26/2015 CLINICAL DATA:  History of arthritis, chronic knee pain EXAM: RIGHT KNEE - COMPLETE 4+ VIEW COMPARISON:  None. FINDINGS: There is  mild narrowing and spurring of the medial compartment. No acute fracture or dislocation. Large suprapatellar joint effusion. IMPRESSION: 1. No acute fracture. 2. Large suprapatellar joint effusion. Electronically Signed   By: Jasmine Pang M.D.   On: 12/26/2015 22:28     PERTINENT LAB RESULTS: CBC:  Recent Labs  01/01/16 0809 01/02/16 0517  WBC 18.6* 10.4  HGB 10.2* 9.6*  HCT 31.5* 29.4*  PLT 334 311   CMET CMP     Component Value Date/Time   NA 141 01/02/2016 0517   K 4.4 01/02/2016 0517   CL 108 01/02/2016 0517   CO2 28 01/02/2016 0517   GLUCOSE 88 01/02/2016 0517   BUN 18 01/02/2016 0517   CREATININE 1.60 (H) 01/02/2016 0517   CALCIUM 8.6 (L) 01/02/2016 0517   GFRNONAA 37 (L) 01/02/2016 0517   GFRAA 43 (L) 01/02/2016 0517    GFR Estimated Creatinine  Clearance: 46.1 mL/min (by C-G formula based on SCr of 1.6 mg/dL (H)). No results for input(s): LIPASE, AMYLASE in the last 72 hours. No results for input(s): CKTOTAL, CKMB, CKMBINDEX, TROPONINI in the last 72 hours. Invalid input(s): POCBNP No results for input(s): DDIMER in the last 72 hours. No results for input(s): HGBA1C in the last 72 hours. No results for input(s): CHOL, HDL, LDLCALC, TRIG, CHOLHDL, LDLDIRECT in the last 72 hours. No results for input(s): TSH, T4TOTAL, T3FREE, THYROIDAB in the last 72 hours.  Invalid input(s): FREET3 No results for input(s): VITAMINB12, FOLATE, FERRITIN, TIBC, IRON, RETICCTPCT in the last 72 hours. Coags: No results for input(s): INR in the last 72 hours.  Invalid input(s): PT Microbiology: Recent Results (from the past 240 hour(s))  Culture, body fluid-bottle     Status: None   Collection Time: 12/27/15  4:00 PM  Result Value Ref Range Status   Specimen Description SYNOVIAL LEFT KNEE  Final   Special Requests BOTTLES DRAWN AEROBIC AND ANAEROBIC 10CC  Final   Culture NO GROWTH 5 DAYS  Final   Report Status 01/01/2016 FINAL  Final  Gram stain     Status: None   Collection Time: 12/27/15  4:00 PM  Result Value Ref Range Status   Specimen Description SYNOVIAL LEFT KNEE  Final   Special Requests NONE  Final   Gram Stain   Final    ABUNDANT WBC PRESENT, PREDOMINANTLY PMN NO ORGANISMS SEEN    Report Status 12/27/2015 FINAL  Final  Culture, body fluid-bottle     Status: None   Collection Time: 12/27/15  4:00 PM  Result Value Ref Range Status   Specimen Description SYNOVIAL RIGHT KNEE  Final   Special Requests BOTTLES DRAWN AEROBIC AND ANAEROBIC 10CC  Final   Culture NO GROWTH 5 DAYS  Final   Report Status 01/01/2016 FINAL  Final  Gram stain     Status: None   Collection Time: 12/27/15  4:00 PM  Result Value Ref Range Status   Specimen Description SYNOVIAL RIGHT KNEE  Final   Special Requests NONE  Final   Gram Stain   Final    ABUNDANT  WBC PRESENT, PREDOMINANTLY PMN NO ORGANISMS SEEN    Report Status 12/27/2015 FINAL  Final  Culture, Urine     Status: None   Collection Time: 12/29/15  1:44 PM  Result Value Ref Range Status   Specimen Description URINE, RANDOM  Final   Special Requests NONE  Final   Culture NO GROWTH  Final   Report Status 12/30/2015 FINAL  Final    FURTHER DISCHARGE  INSTRUCTIONS:  Get Medicines reviewed and adjusted: Please take all your medications with you for your next visit with your Primary MD  Laboratory/radiological data: Please request your Primary MD to go over all hospital tests and procedure/radiological results at the follow up, please ask your Primary MD to get all Hospital records sent to his/her office.  In some cases, they will be blood work, cultures and biopsy results pending at the time of your discharge. Please request that your primary care M.D. goes through all the records of your hospital data and follows up on these results.  Also Note the following: If you experience worsening of your admission symptoms, develop shortness of breath, life threatening emergency, suicidal or homicidal thoughts you must seek medical attention immediately by calling 911 or calling your MD immediately  if symptoms less severe.  You must read complete instructions/literature along with all the possible adverse reactions/side effects for all the Medicines you take and that have been prescribed to you. Take any new Medicines after you have completely understood and accpet all the possible adverse reactions/side effects.   Do not drive when taking Pain medications or sleeping medications (Benzodaizepines)  Do not take more than prescribed Pain, Sleep and Anxiety Medications. It is not advisable to combine anxiety,sleep and pain medications without talking with your primary care practitioner  Special Instructions: If you have smoked or chewed Tobacco  in the last 2 yrs please stop smoking, stop any  regular Alcohol  and or any Recreational drug use.  Wear Seat belts while driving.  Please note: You were cared for by a hospitalist during your hospital stay. Once you are discharged, your primary care physician will handle any further medical issues. Please note that NO REFILLS for any discharge medications will be authorized once you are discharged, as it is imperative that you return to your primary care physician (or establish a relationship with a primary care physician if you do not have one) for your post hospital discharge needs so that they can reassess your need for medications and monitor your lab values.  Total Time spent coordinating discharge including counseling, education and face to face time equals  45 minutes.  SignedJeoffrey Massed 01/02/2016 11:17 AM

## 2016-01-02 NOTE — Care Management Note (Signed)
Case Management Note  Patient Details  Name: Rockey Situonya Claytor MRN: [id number] Date of Birth: 07/22/1965  Subjective/Objective:         CM following for progression and d/c planning.             Action/Plan: 01/02/2016 Pt for d/c to home no HH needs, DME ordered. Hospital followup appointment scheduled.   Expected Discharge Date:  01/02/2016               Expected Discharge Plan:  Home/Self Care  In-House Referral:     Discharge planning Services  CM Consult, [facility]  Post Acute Care Choice:  Durable Medical Equipment Choice offered to:  Patient  DME Arranged:  3-N-1, Walker rolling DME Agency:  [facility]  HH Arranged:   NA HH Agency:   NA  Status of Service:  Completed, signed off  If discussed at Long Length of Stay Meetings, dates discussed:    Additional Comments:  Starlyn SkeansRoyal, Sabena Winner U, RN 01/02/2016, 10:13 AM

## 2016-01-02 NOTE — Progress Notes (Signed)
Discharge instructions and medications discussed with patient.  Prescriptions given to patient.  All questions answered.  

## 2016-01-03 DIAGNOSIS — J028 Acute pharyngitis due to other specified organisms: Secondary | ICD-10-CM

## 2016-01-09 ENCOUNTER — Inpatient Hospital Stay: Payer: Self-pay

## 2016-01-09 LAB — VIRUS CULTURE

## 2016-05-04 ENCOUNTER — Emergency Department (HOSPITAL_BASED_OUTPATIENT_CLINIC_OR_DEPARTMENT_OTHER): Payer: Self-pay

## 2016-05-04 ENCOUNTER — Encounter (HOSPITAL_BASED_OUTPATIENT_CLINIC_OR_DEPARTMENT_OTHER): Payer: Self-pay | Admitting: *Deleted

## 2016-05-04 ENCOUNTER — Emergency Department (HOSPITAL_BASED_OUTPATIENT_CLINIC_OR_DEPARTMENT_OTHER)
Admission: EM | Admit: 2016-05-04 | Discharge: 2016-05-04 | Disposition: A | Discharge status: Home / Self Care | Payer: Self-pay | Attending: Emergency Medicine | Admitting: Emergency Medicine

## 2016-05-04 DIAGNOSIS — R69 Illness, unspecified: Secondary | ICD-10-CM

## 2016-05-04 DIAGNOSIS — R05 Cough: Secondary | ICD-10-CM | POA: Insufficient documentation

## 2016-05-04 DIAGNOSIS — R509 Fever, unspecified: Secondary | ICD-10-CM | POA: Insufficient documentation

## 2016-05-04 DIAGNOSIS — J3489 Other specified disorders of nose and nasal sinuses: Secondary | ICD-10-CM | POA: Insufficient documentation

## 2016-05-04 DIAGNOSIS — R0981 Nasal congestion: Secondary | ICD-10-CM | POA: Insufficient documentation

## 2016-05-04 DIAGNOSIS — F17210 Nicotine dependence, cigarettes, uncomplicated: Secondary | ICD-10-CM | POA: Insufficient documentation

## 2016-05-04 DIAGNOSIS — M791 Myalgia: Secondary | ICD-10-CM | POA: Insufficient documentation

## 2016-05-04 DIAGNOSIS — I10 Essential (primary) hypertension: Secondary | ICD-10-CM

## 2016-05-04 DIAGNOSIS — R059 Cough, unspecified: Secondary | ICD-10-CM

## 2016-05-04 DIAGNOSIS — J111 Influenza due to unidentified influenza virus with other respiratory manifestations: Secondary | ICD-10-CM

## 2016-05-04 MED ORDER — AMLODIPINE BESYLATE 10 MG PO TABS: 10.0000 mg | ORAL_TABLET | Freq: Every day | ORAL | 1 refills | Status: AC

## 2016-05-04 MED ORDER — BENZONATATE 100 MG PO CAPS: 100.0000 mg | ORAL_CAPSULE | Freq: Three times a day (TID) | ORAL | 0 refills | Status: AC

## 2016-05-04 MED ORDER — TRAMADOL HCL 50 MG PO TABS
50.0000 mg | ORAL_TABLET | Freq: Once | ORAL | Status: AC
  Administered 2016-05-04: 50 mg via ORAL
  Filled 2016-05-04: qty 1

## 2016-05-04 NOTE — ED Triage Notes (Signed)
Cough x 3 days

## 2016-05-04 NOTE — ED Provider Notes (Signed)
[facility] Provider Note   CSN: [id number] Arrival date & time: 05/04/16  1231     History   Chief Complaint Chief Complaint  Patient presents with  . Cough    HPI Amy Gibbs is a 51 y.o. female.  Patient is a 51 year old female with a history of anxiety, hyperlipidemia, hypertension and cocaine abuse who presents with flulike symptoms. She reports a 3 to four-day history of runny nose congestion and coughing. She has diffuse myalgias. She denies any nausea or vomiting. She has some shortness of breath only with coughing. She reports some subjective fevers. She's been using over-the-counter symptoms without improvement in symptoms.      Past Medical History:  Diagnosis Date  . Anxiety   . Arthritis   . Chronic back pain   . Chronic knee pain   . Cocaine abuse   . High cholesterol   . Hypertension   . Joint pain   . Trichomoniasis 12/29/2015    Patient Active Problem List   Diagnosis Date Noted  . Acute pharyngitis due to other specified organisms   . Trichomoniasis 12/29/2015  . 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 AKI (acute kidney injury) (HCC) 12/27/2015  . Hypertension   . Cocaine abuse   . Anxiety     Past Surgical History:  Procedure Laterality Date  . ABDOMINAL HYSTERECTOMY      OB History    No data available       Home Medications    Prior to Admission medications   Medication Sig Start Date End Date Taking? Authorizing Provider  amLODipine (NORVASC) 10 MG tablet Take 1 tablet (10 mg total) by mouth daily. 05/04/16   Rolan Bucco, MD  benzonatate (TESSALON) 100 MG capsule Take 1 capsule (100 mg total) by mouth every 8 (eight) hours. 05/04/16   Rolan Bucco, MD    Family History History reviewed. No pertinent family history.  Social History Social History  Substance Use Topics  . Smoking status: Current Some Day Smoker    Packs/day: 0.50    Types: Cigarettes  . Smokeless tobacco: Never  Used  . Alcohol use No     Allergies   Patient has no known allergies.   Review of Systems Review of Systems  Constitutional: Positive for chills, fatigue and fever. Negative for diaphoresis.  HENT: Positive for congestion and rhinorrhea. Negative for sneezing.   Eyes: Negative.   Respiratory: Positive for cough. Negative for chest tightness and shortness of breath.   Cardiovascular: Negative for chest pain and leg swelling.  Gastrointestinal: Negative for abdominal pain, blood in stool, diarrhea, nausea and vomiting.  Genitourinary: Negative for difficulty urinating, flank pain, frequency and hematuria.  Musculoskeletal: Positive for myalgias. Negative for arthralgias and back pain.  Skin: Negative for rash.  Neurological: Negative for dizziness, speech difficulty, weakness, numbness and headaches.     Physical Exam Updated Vital Signs BP (!) 186/112   Pulse 61   Temp 98.7 F (37.1 C) (Oral)   Resp 18   Wt 175 lb (79.4 kg)   SpO2 100%   BMI 27.41 kg/m   Physical Exam  Constitutional: She is oriented to person, place, and time. She appears well-developed and well-nourished.  HENT:  Head: Normocephalic and atraumatic.  Right Ear: External ear normal.  Left Ear: External ear normal.  Mouth/Throat: Oropharynx is clear and moist.  Eyes: Pupils are equal, round, and reactive to light.  Neck: Normal range of motion. Neck supple.  Cardiovascular: Normal  rate, regular rhythm and normal heart sounds.   Pulmonary/Chest: Effort normal and breath sounds normal. No respiratory distress. She has no wheezes. She has no rales. She exhibits no tenderness.  Abdominal: Soft. Bowel sounds are normal. There is no tenderness. There is no rebound and no guarding.  Musculoskeletal: Normal range of motion. She exhibits no edema.  Lymphadenopathy:    She has no cervical adenopathy.  Neurological: She is alert and oriented to person, place, and time.  Skin: Skin is warm and dry. No rash  noted.  Psychiatric: She has a normal mood and affect.     ED Treatments / Results  Labs (all labs ordered are listed, but only abnormal results are displayed) Labs Reviewed - No data to display  EKG  EKG Interpretation None       Radiology Dg Chest 2 View  Result Date: 05/04/2016 CLINICAL DATA:  Cough and congestion EXAM: CHEST  2 VIEW COMPARISON:  December 26, 2015 FINDINGS: There is no edema or consolidation. The heart size and pulmonary vascularity are normal. No adenopathy. No bone lesions. IMPRESSION: No edema or consolidation. Electronically Signed   By: Bretta BangWilliam  Woodruff III M.D.   On: 05/04/2016 13:13    Procedures Procedures (including critical care time)  Medications Ordered in ED Medications  traMADol (ULTRAM) tablet 50 mg (50 mg Oral Given 05/04/16 1437)     Initial Impression / Assessment and Plan / ED Course  I have reviewed the triage vital signs and the nursing notes.  Pertinent labs & imaging results that were available during my care of the patient were reviewed by me and considered in my medical decision making (see chart for details).     Patient presents with flulike symptoms. She's out of the window for Tamiflu. Her chest x-ray is clear without evidence of pneumonia. She's otherwise well-appearing. She was discharged home in good condition. Symptomatic care instructions were given. She was given Jerilynn Somessalon Perles for the cough. Of note her blood pressure is markedly elevated. She states she's out of her blood pressure medicine. She was given a prescription for this and encouraged to obtain primary care. He was given a Facilities managerresource guide for outpatient follow-up.  Final Clinical Impressions(s) / ED Diagnoses   Final diagnoses:  Cough  Influenza-like illness  Essential hypertension    New Prescriptions New Prescriptions   BENZONATATE (TESSALON) 100 MG CAPSULE    Take 1 capsule (100 mg total) by mouth every 8 (eight) hours.     Rolan BuccoMelanie Otis Portal,  MD 05/04/16 [phone]

## 2016-10-12 DIAGNOSIS — Z79899 Other long term (current) drug therapy: Secondary | ICD-10-CM | POA: Insufficient documentation

## 2016-10-12 DIAGNOSIS — I10 Essential (primary) hypertension: Secondary | ICD-10-CM | POA: Insufficient documentation

## 2016-10-12 DIAGNOSIS — N289 Disorder of kidney and ureter, unspecified: Secondary | ICD-10-CM | POA: Insufficient documentation

## 2016-10-12 DIAGNOSIS — F1910 Other psychoactive substance abuse, uncomplicated: Secondary | ICD-10-CM | POA: Insufficient documentation

## 2016-10-12 DIAGNOSIS — Z7982 Long term (current) use of aspirin: Secondary | ICD-10-CM | POA: Insufficient documentation

## 2016-10-12 DIAGNOSIS — F17210 Nicotine dependence, cigarettes, uncomplicated: Secondary | ICD-10-CM | POA: Insufficient documentation

## 2016-10-13 ENCOUNTER — Encounter (HOSPITAL_COMMUNITY): Payer: Self-pay

## 2016-10-13 ENCOUNTER — Emergency Department (HOSPITAL_COMMUNITY)
Admission: EM | Admit: 2016-10-13 | Discharge: 2016-10-13 | Disposition: A | Discharge status: Home / Self Care | Payer: Self-pay | Attending: Emergency Medicine | Admitting: Emergency Medicine

## 2016-10-13 DIAGNOSIS — N289 Disorder of kidney and ureter, unspecified: Secondary | ICD-10-CM

## 2016-10-13 DIAGNOSIS — F1910 Other psychoactive substance abuse, uncomplicated: Secondary | ICD-10-CM

## 2016-10-13 DIAGNOSIS — I10 Essential (primary) hypertension: Secondary | ICD-10-CM

## 2016-10-13 LAB — COMPREHENSIVE METABOLIC PANEL
ALBUMIN: 3.7 g/dL (ref 3.5–5.0)
ALT: 18 U/L (ref 14–54)
AST: 20 U/L (ref 15–41)
Alkaline Phosphatase: 98 U/L (ref 38–126)
Anion gap: 8 (ref 5–15)
BILIRUBIN TOTAL: 0.2 mg/dL — AB (ref 0.3–1.2)
BUN: 29 mg/dL — AB (ref 6–20)
CHLORIDE: 111 mmol/L (ref 101–111)
CO2: 23 mmol/L (ref 22–32)
Calcium: 9.1 mg/dL (ref 8.9–10.3)
Creatinine, Ser: 2.03 mg/dL — ABNORMAL HIGH (ref 0.44–1.00)
GFR calc Af Amer: 32 mL/min — ABNORMAL LOW (ref >60)
GFR calc non Af Amer: 27 mL/min — ABNORMAL LOW (ref >60)
GLUCOSE: 90 mg/dL (ref 65–99)
POTASSIUM: 4 mmol/L (ref 3.5–5.1)
Sodium: 142 mmol/L (ref 135–145)
TOTAL PROTEIN: 7.2 g/dL (ref 6.5–8.1)

## 2016-10-13 LAB — CBC
HEMATOCRIT: 31 % — AB (ref 36.0–46.0)
HEMOGLOBIN: 10.2 g/dL — AB (ref 12.0–15.0)
MCH: 31 pg (ref 26.0–34.0)
MCHC: 32.9 g/dL (ref 30.0–36.0)
MCV: 94.2 fL (ref 78.0–100.0)
Platelets: 281 10*3/uL (ref 150–400)
RBC: 3.29 MIL/uL — ABNORMAL LOW (ref 3.87–5.11)
RDW: 15.1 % (ref 11.5–15.5)
WBC: 6.4 10*3/uL (ref 4.0–10.5)

## 2016-10-13 LAB — ETHANOL: Alcohol, Ethyl (B): <5 mg/dL (ref <5)

## 2016-10-13 LAB — RAPID URINE DRUG SCREEN, HOSP PERFORMED
Amphetamines: NOT DETECTED
Barbiturates: NOT DETECTED
Benzodiazepines: NOT DETECTED
COCAINE: POSITIVE — AB
Opiates: NOT DETECTED
Tetrahydrocannabinol: NOT DETECTED

## 2016-10-13 MED ORDER — ACETAMINOPHEN 325 MG PO TABS: 650.0000 mg | ORAL_TABLET | ORAL | Status: DC | PRN

## 2016-10-13 MED ORDER — NICOTINE 21 MG/24HR TD PT24: 21.0000 mg | MEDICATED_PATCH | Freq: Every day | TRANSDERMAL | Status: DC

## 2016-10-13 MED ORDER — LORAZEPAM 1 MG PO TABS: 1.0000 mg | ORAL_TABLET | ORAL | Status: DC | PRN

## 2016-10-13 MED ORDER — CLONIDINE HCL 0.1 MG PO TABS: 0.1000 mg | ORAL_TABLET | Freq: Two times a day (BID) | ORAL | 0 refills | Status: AC

## 2016-10-13 MED ORDER — ASPIRIN 81 MG PO CHEW: 81.0000 mg | CHEWABLE_TABLET | Freq: Every day | ORAL | Status: DC

## 2016-10-13 MED ORDER — AMLODIPINE BESYLATE 5 MG PO TABS: 10.0000 mg | ORAL_TABLET | Freq: Every day | ORAL | Status: DC

## 2016-10-13 MED ORDER — PROMETHAZINE HCL 25 MG PO TABS: 25.0000 mg | ORAL_TABLET | Freq: Three times a day (TID) | ORAL | 0 refills | Status: AC | PRN

## 2016-10-13 MED ORDER — CLONIDINE HCL 0.1 MG PO TABS
0.1000 mg | ORAL_TABLET | Freq: Two times a day (BID) | ORAL | Status: DC
  Administered 2016-10-13: 0.1 mg via ORAL
  Filled 2016-10-13: qty 1

## 2016-10-13 MED ORDER — ONDANSETRON HCL 4 MG PO TABS: 4.0000 mg | ORAL_TABLET | Freq: Three times a day (TID) | ORAL | Status: DC | PRN

## 2016-10-13 NOTE — ED Notes (Signed)
Bed: [facility] Expected date:  Expected time:  Means of arrival:  Comments: 

## 2016-10-13 NOTE — BH Assessment (Addendum)
Tele Assessment Note   Amy Gibbs is an 51 y.o. female who presents to the ED voluntarily due to increased SA and requesting help with getting off of drugs. Pt reports she has been using drugs for the past 27 years and she feels she is ready to get clean. Pt stated "I'm tired. I'm ready to get off drugs." Pt denies any prior SA treatment. Pt denies SI, HI, AVH. Pt reports when she is using drugs she sometimes sees "spots like dots and hears whispers" that began several months ago but denies experiencing this when she is sober. Pt states she sleeps for only 2-3 hours each night and has lost 15 lbs due to not eating. Pt identifies her recent stressors as her mother passed away in December 2017 and her disability is currently pending and not having any income at this time. Pt reports PTA to ED she contacted [facility] to inquire about SA treatment and was told due to the pt taking high blood pressure medication, [facility] which prompted her to come to the hospital in order to seek further assistance with SA treatment.   Per Donell SievertSpencer Simon, PA pt does not meet criteria for inpt treatment. Pt has been provided with OPT resources for SA treatment and TTS reviewed the SA treatment facilities with the pt. Pt verbalized understanding and reports she will follow up with the facilities. Discussed case with EDP Dr. Bebe ShaggyWickline, MD and RN Matilde Haymakerwolabi, Atilade A, RN.  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 Anxiety   . Arthritis   . Chronic back pain   . Chronic knee pain   . Cocaine abuse   . High cholesterol   . Hypertension   . Joint pain   . Trichomoniasis 12/29/2015    Past Surgical History:  Procedure Laterality Date  . ABDOMINAL HYSTERECTOMY      Family History: History reviewed. No pertinent family history.  Social History:  reports that she has been  smoking Cigarettes.  She has been smoking about 0.50 packs per day. She has never used smokeless tobacco. She reports that she uses drugs, including Cocaine. She reports that she does not drink alcohol.  Additional Social History:  Alcohol / Drug Use Pain Medications: See MAR Prescriptions: See MAR Over the Counter: See MAR History of alcohol / drug use?: Yes Longest period of sobriety (when/how long): none Substance #1 Name of Substance 1: Heroin 1 - Age of First Use: 24 1 - Amount (size/oz): 4 grams 1 - Frequency: daily 1 - Duration: ongoing 1 - Last Use / Amount: PTA Substance #2 Name of Substance 2: Cocaine 2 - Age of First Use: 24 2 - Amount (size/oz): 4 grams 2 - Frequency: daily 2 - Duration: ongoing 2 - Last Use / Amount: PTA Substance #3 Name of Substance 3: Pain pills, pt stated "Percs and Roxy's" 3 - Age of First Use: 24 3 - Amount (size/oz): 10 pills 3 - Frequency: daily 3 - Duration: ongoing 3 - Last Use / Amount: PTA  CIWA: CIWA-Ar BP: (!) 157/108 Pulse Rate: 65 COWS:    PATIENT STRENGTHS: (choose at least two) Ability for insight Average or above average intelligence Communication skills Motivation for treatment/growth  Allergies: No Known Allergies  Home Medications:  (Not in a hospital admission)  OB/GYN Status:  No LMP recorded. Patient has had a hysterectomy.  General Assessment Data [facility] of Assessment: [facility] TTS Assessment: In system Is this a Tele or Face-to-Face Assessment?: Face-to-Face Is this an Initial Assessment or a Re-assessment for this encounter?: Initial Assessment Marital status: Separated Is patient pregnant?: No Pregnancy Status: No Living Arrangements: Non-relatives/Friends Can pt return to current living arrangement?: Yes Admission Status: Voluntary Is patient capable of signing voluntary admission?: Yes Referral Source: Self/Family/Friend Insurance type: none     Crisis Care Plan Living Arrangements:  Non-relatives/Friends Name of Psychiatrist: none Name of Therapist: none  Education Status Is patient currently in school?: No Highest grade of school patient has completed: 11th  Risk to self with the past 6 months Suicidal Ideation: No Has patient been a risk to self within the past 6 months prior to admission? : No Suicidal Intent: No Has patient had any suicidal intent within the past 6 months prior to admission? : No Is patient at risk for suicide?: No Suicidal Plan?: No Has patient had any suicidal plan within the past 6 months prior to admission? : No Access to Means: No What has been your use of drugs/alcohol within the last 12 months?: reports to daily cocaine, heroin, and opiate use  Previous Attempts/Gestures: No Triggers for Past Attempts: None known Intentional Self Injurious Behavior: None Family Suicide History: No Recent stressful life event(s): Other (Comment), Loss (Comment) (increased drug use, mother passed away 2017) Persecutory voices/beliefs?: No Depression: Yes Depression Symptoms: Loss of interest in usual pleasures, Insomnia, Feeling worthless/self pity, Guilt Substance abuse history and/or treatment for substance abuse?: Yes Suicide prevention information given to non-admitted patients: Yes  Risk to Others within the past 6 months Homicidal Ideation: No Does patient have any lifetime risk of violence toward others beyond the six months prior to admission? : No Thoughts of Harm to Others: No Current Homicidal Intent: No Current Homicidal Plan: No Access to Homicidal Means: No History of harm to others?: No Assessment of Violence: None Noted Does patient have access to weapons?: No Criminal Charges Pending?: No Does patient have a court date: No Is patient on probation?: No  Psychosis Hallucinations: Visual, Auditory (reports "only while using drugs") Delusions: None noted  Mental Status Report Appearance/Hygiene: Unremarkable Eye Contact:  Good Motor Activity: Freedom of movement Speech: Logical/coherent Level of Consciousness: Alert Mood: Depressed, Guilty, Worthless, low self-esteem Affect: Appropriate to circumstance Anxiety Level: Minimal Thought Processes: Relevant, Coherent Judgement: Unimpaired Orientation: Person, Place, Time, Situation, Appropriate for developmental age Obsessive Compulsive Thoughts/Behaviors: None  Cognitive Functioning Concentration: Normal Memory: Remote Intact, Recent Intact IQ: Average Insight: Good Impulse Control: Good Appetite: Poor Weight Loss: 15 Sleep: Decreased Total Hours of Sleep: 3 Vegetative Symptoms: None  ADLScreening [facility] Assessment Services) Patient's cognitive ability adequate to safely complete daily activities?: Yes Patient able to express need for assistance with ADLs?: Yes Independently performs ADLs?: Yes (appropriate for developmental age)  Prior Inpatient Therapy Prior Inpatient Therapy: No  Prior Outpatient Therapy Prior Outpatient Therapy: No Does patient have an ACCT team?: No Does patient have Intensive In-House Services?  : No Does patient have [facility] services? : No Does patient have P4CC services?: No  ADL Screening (condition at time of admission) Patient's cognitive ability adequate to safely complete daily activities?: Yes Is the patient deaf or have difficulty hearing?: No Does the patient have difficulty seeing, even when wearing glasses/contacts?: No Does the patient have difficulty concentrating, remembering, or making decisions?: No Patient able to express need for assistance with ADLs?: Yes Does the patient have difficulty dressing or  bathing?: No Independently performs ADLs?: Yes (appropriate for developmental age) Does the patient have difficulty walking or climbing stairs?: No Weakness of Legs: None Weakness of Arms/Hands: None  Home Assistive Devices/Equipment Home Assistive Devices/Equipment: None    Abuse/Neglect  Assessment (Assessment to be complete while patient is alone) Physical Abuse: Denies Verbal Abuse: Denies Sexual Abuse: Denies Exploitation of patient/patient's resources: Denies Self-Neglect: Denies     Merchant navy officer (For Healthcare) Does Patient Have a Medical Advance Directive?: No Would patient like information on creating a medical advance directive?: No - Patient declined    Additional Information 1:1 In Past 12 Months?: No CIRT Risk: No Elopement Risk: No Does patient have medical clearance?:  (pending)     Disposition:  Disposition Initial Assessment Completed for this Encounter: Yes Disposition of Patient: Other dispositions Other disposition(s): Other (Comment) (d/c with OPT resources per Donell Sievert, PA)  Karolee Ohs 10/13/2016 4:44 AM

## 2016-10-13 NOTE — Progress Notes (Signed)
Per Donell SievertSpencer Simon, PA pt does not meet criteria for inpt treatment. Pt has been provided with OPT resources for SA treatment and TTS reviewed the SA treatment facilities with the pt. Pt verbalized understanding and reports she will follow up with the facilities. Discussed case with EDP Dr. Bebe ShaggyWickline, MD and RN Matilde Haymakerwolabi, Atilade A, RN.  Princess BruinsAquicha Mckenzee Beem, MSW, LCSWA TTS Specialist [phone]

## 2016-10-13 NOTE — ED Notes (Signed)
ED Provider at bedside. 

## 2016-10-13 NOTE — ED Provider Notes (Signed)
[facility] Provider Note   CSN: [id number] Arrival date & time: 10/12/16  2305     History   Chief Complaint Chief Complaint  Patient presents with  . Medical Clearance    Detox    HPI Amy Gibbs is a 51 y.o. female.  The history is provided by the patient.  Patient reports she is here to get help with her drug problems She reports she snorts heroin and smokes crack cocaine Last use within past 24 hrs She reports it is time to quit She denies ETOH use She reports abdominal cramping and nausea/diarrhea No fever No CP No SI reported Her course is worsening and nothing improves her symptoms  Past Medical History:  Diagnosis Date  . Anxiety   . Arthritis   . Chronic back pain   . Chronic knee pain   . Cocaine abuse   . High cholesterol   . Hypertension   . Joint pain   . Trichomoniasis 12/29/2015    Patient Active Problem List   Diagnosis Date Noted  . Acute pharyngitis due to other specified organisms   . Trichomoniasis 12/29/2015  . 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 AKI (acute kidney injury) (HCC) 12/27/2015  . Hypertension   . Cocaine abuse   . Anxiety     Past Surgical History:  Procedure Laterality Date  . ABDOMINAL HYSTERECTOMY      OB History    No data available       Home Medications    Prior to Admission medications   Medication Sig Start Date End Date Taking? Authorizing Provider  amLODipine (NORVASC) 10 MG tablet Take 1 tablet (10 mg total) by mouth daily. 05/04/16  Yes Rolan BuccoBelfi, Melanie, MD  aspirin 81 MG chewable tablet Chew 81 mg by mouth daily.   Yes [provider]  hydrALAZINE (APRESOLINE) 50 MG tablet Take 50 mg by mouth 3 (three) times daily. 09/11/16  Yes [provider]  benzonatate (TESSALON) 100 MG capsule Take 1 capsule (100 mg total) by mouth every 8 (eight) hours. Patient not taking: Reported on 10/13/2016 05/04/16   Rolan BuccoBelfi, Melanie, MD    Family History History  reviewed. No pertinent family history.  Social History Social History  Substance Use Topics  . Smoking status: Current Some Day Smoker    Packs/day: 0.50    Types: Cigarettes  . Smokeless tobacco: Never Used  . Alcohol use No     Allergies   Patient has no known allergies.   Review of Systems Review of Systems  Constitutional: Negative for fever.  Cardiovascular: Negative for chest pain.  Gastrointestinal: Positive for nausea.  Psychiatric/Behavioral: Negative for suicidal ideas.  All other systems reviewed and are negative.    Physical Exam Updated Vital Signs BP (!) 157/108 (BP Location: Left Arm)   Pulse 65   Temp 98.3 F (36.8 C) (Oral)   Resp 18   SpO2 100%   Physical Exam CONSTITUTIONAL: Disheveled, no acute distress HEAD: Normocephalic/atraumatic EYES: EOMI/PERRL ENMT: Mucous membranes moist NECK: supple no meningeal signs SPINE/BACK:entire spine nontender CV: S1/S2 noted, no murmurs/rubs/gallops noted LUNGS: Lungs are clear to auscultation bilaterally, no apparent distress ABDOMEN: soft, nontender  GU:no cva tenderness NEURO: Pt is awake/alert/appropriate, moves all extremitiesx4.  No facial droop.   EXTREMITIES: pulses normal/equal, full ROM SKIN: warm, color normal PSYCH: no abnormalities of mood noted, alert and oriented to situation   ED Treatments / Results  Labs (all labs ordered are listed, but only abnormal results  are displayed) Labs Reviewed  COMPREHENSIVE METABOLIC PANEL - Abnormal; Notable for the following:       Result Value   BUN 29 (*)    Creatinine, Ser 2.03 (*)    Total Bilirubin 0.2 (*)    GFR calc non Af Amer 27 (*)    GFR calc Af Amer 32 (*)    All other components within normal limits  CBC - Abnormal; Notable for the following:    RBC 3.29 (*)    Hemoglobin 10.2 (*)    HCT 31.0 (*)    All other components within normal limits  RAPID URINE DRUG SCREEN, HOSP PERFORMED - Abnormal; Notable for the following:    Cocaine  POSITIVE (*)    All other components within normal limits  ETHANOL    EKG  EKG Interpretation None       Radiology No results found.  Procedures Procedures    Medications Ordered in ED Medications  cloNIDine (CATAPRES) tablet 0.1 mg (0.1 mg Oral Given 10/13/16 0318)  amLODipine (NORVASC) tablet 10 mg (not administered)  aspirin chewable tablet 81 mg (not administered)  ondansetron (ZOFRAN) tablet 4 mg (not administered)  nicotine (NICODERM CQ - dosed in mg/24 hours) patch 21 mg (not administered)  LORazepam (ATIVAN) tablet 1 mg (not administered)  acetaminophen (TYLENOL) tablet 650 mg (not administered)     Initial Impression / Assessment and Plan / ED Course  I have reviewed the triage vital signs and the nursing notes.  Pertinent labs  results that were available during my care of the patient were reviewed by me and considered in my medical decision making (see chart for details).     Pt with long h/o substance abuse seeking treatment Denies SI Reports noncompliance with BP meds Will start clonidine for BP control and also withdrawal symptoms I have consulted psych for placement, they report she does not meet criteria and outpatient resources given  She will need followup for renal insufficiency, pt advised of this and will give info for PCP followup  Final Clinical Impressions(s) / ED Diagnoses   Final diagnoses:  Polysubstance abuse  Essential hypertension  Renal insufficiency    New Prescriptions New Prescriptions   CLONIDINE (CATAPRES) 0.1 MG TABLET    Take 1 tablet (0.1 mg total) by mouth 2 (two) times daily.   PROMETHAZINE (PHENERGAN) 25 MG TABLET    Take 1 tablet (25 mg total) by mouth every 8 (eight) hours as needed for nausea or vomiting.     Zadie RhineWickline, Rekia Kujala, MD 10/13/16 [phone]

## 2016-10-13 NOTE — ED Triage Notes (Signed)
Pt requesting detox resources for Crack and Heroine abuse. Pt reports last use of both Crack and Heroine yesterday. Pt denies alcohol consumption. Pt 8/10 abd cramping, diarrhea, and nausea. Pt A+OX4, speaking in complete sentences, ambulatory to triage.

## 2016-10-16 ENCOUNTER — Encounter: Payer: Self-pay | Admitting: Pediatric Intensive Care

## 2016-10-16 MED FILL — PROMETHAZINE 25 MG TABLET: 25 | 5 days supply | Qty: 15 | Fill #0

## 2016-10-16 MED FILL — cloNIDine HCL 0.1 MG TABS: 0.1 | 7 days supply | Qty: 14 | Fill #0

## 2016-10-20 ENCOUNTER — Encounter: Payer: Self-pay | Admitting: Pediatric Intensive Care

## 2016-10-23 ENCOUNTER — Encounter: Payer: Self-pay | Admitting: Pediatric Intensive Care

## 2016-10-26 ENCOUNTER — Other Ambulatory Visit: Payer: Self-pay | Admitting: Obstetrics and Gynecology

## 2016-10-26 DIAGNOSIS — R928 Other abnormal and inconclusive findings on diagnostic imaging of breast: Secondary | ICD-10-CM

## 2016-11-02 ENCOUNTER — Encounter: Payer: Self-pay | Admitting: Pediatric Intensive Care

## 2016-11-02 IMAGING — US US RENAL
1 series · 14 of 25 positions shown · non-contrast
Comparison: None.

CLINICAL DATA: Acute renal disease.

EXAM:
RENAL / URINARY TRACT ULTRASOUND COMPLETE

[Series 1: us renal · 0.22mm/px · 14 of 32 slices shown]
[im 1/32]
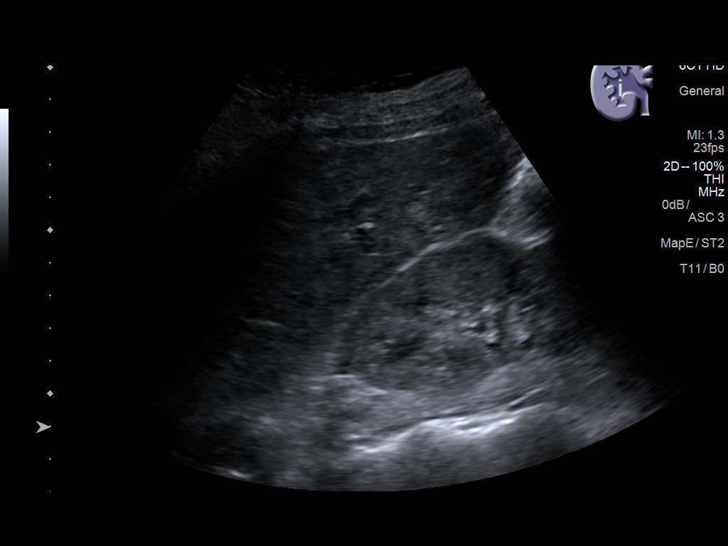
[im 3/32]
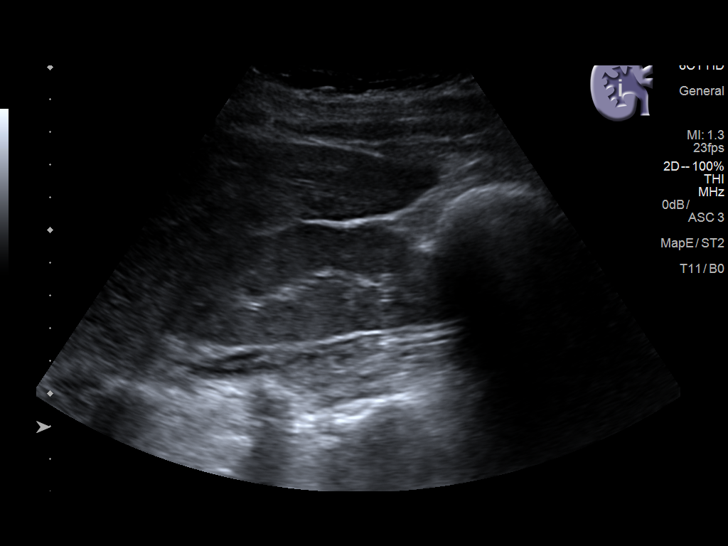
[im 6/32]
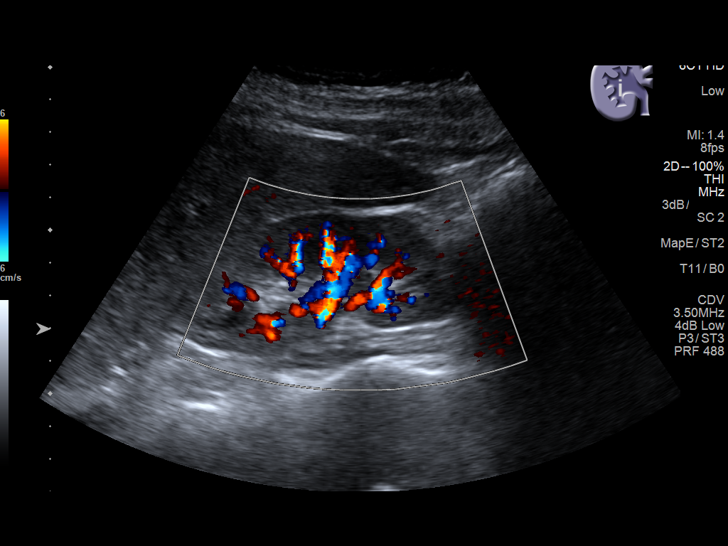
[im 8/32]
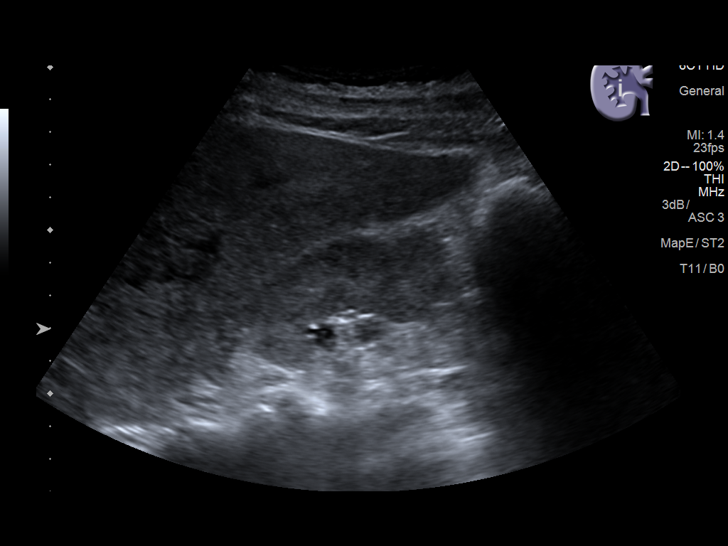
[im 11/32]
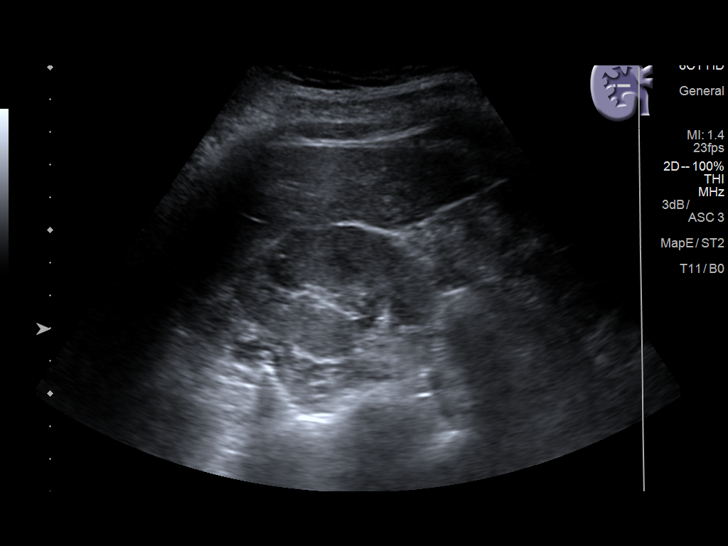
[im 12/32]
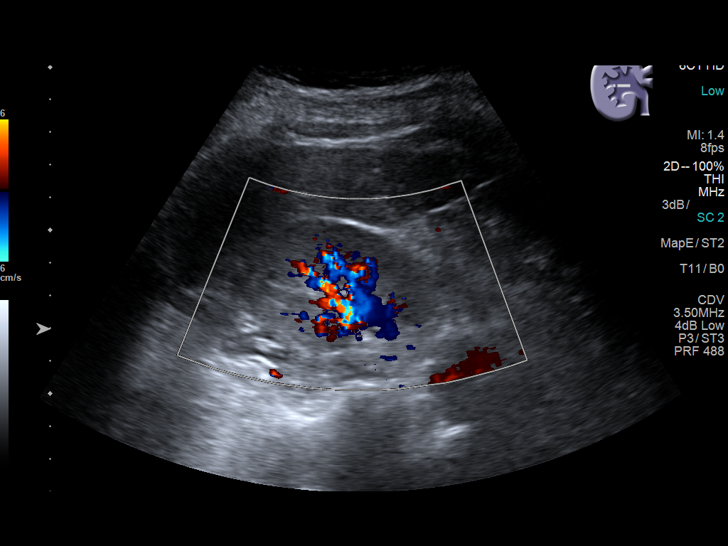
[im 15/32]
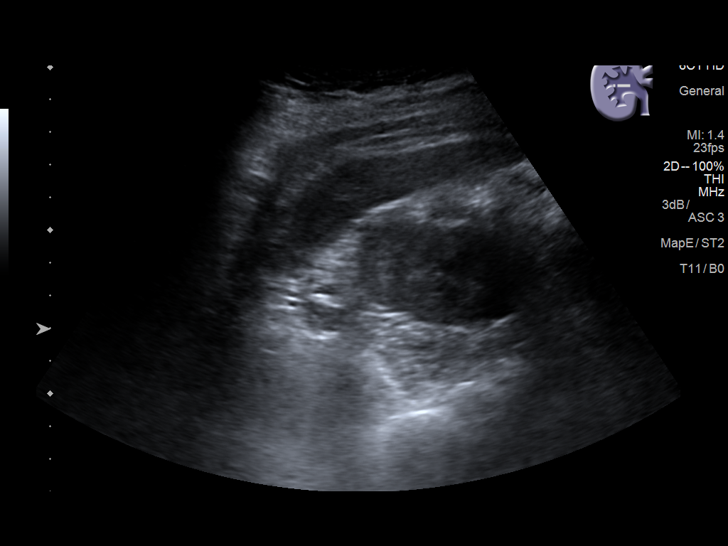
[im 17/32]
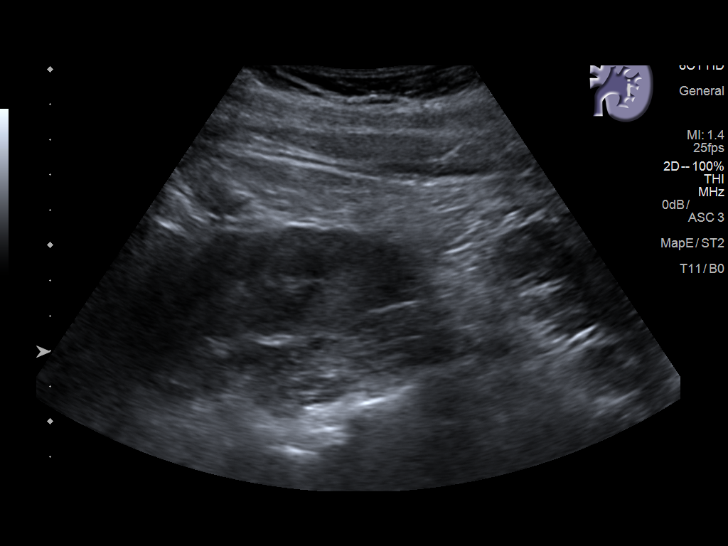
[im 20/32]
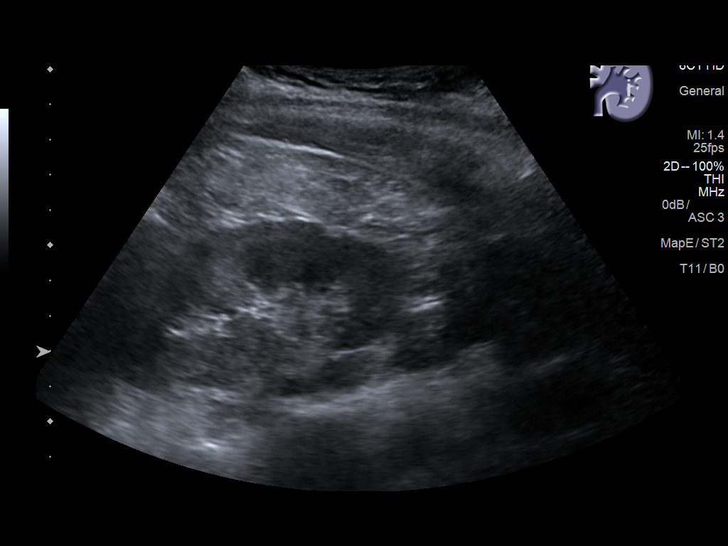
[im 21/32]
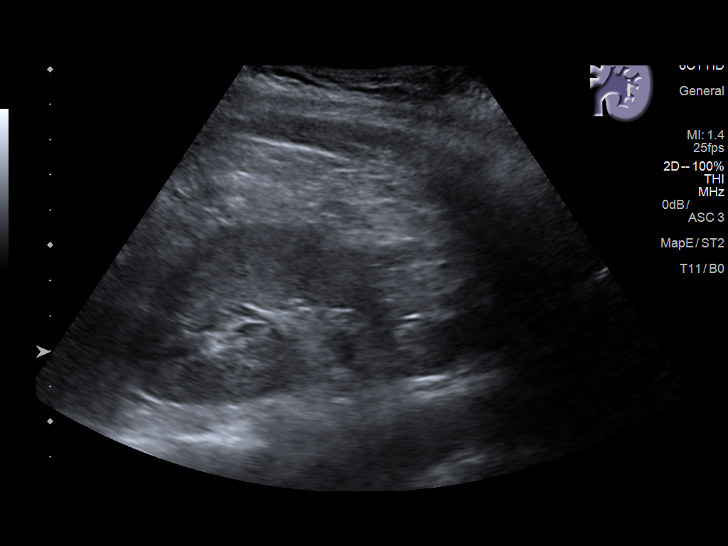
[im 24/32]
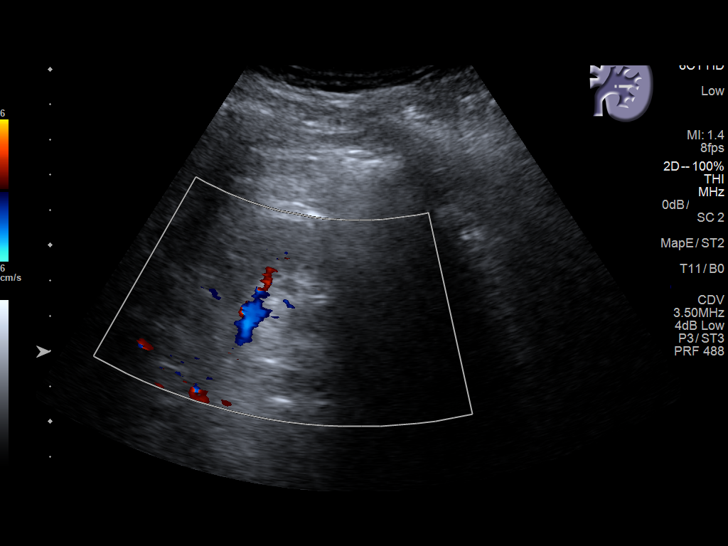
[im 26/32]
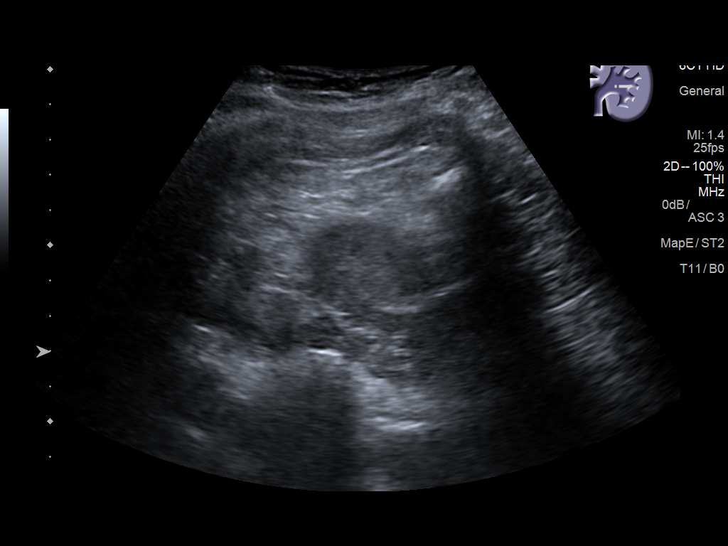
[im 29/32]
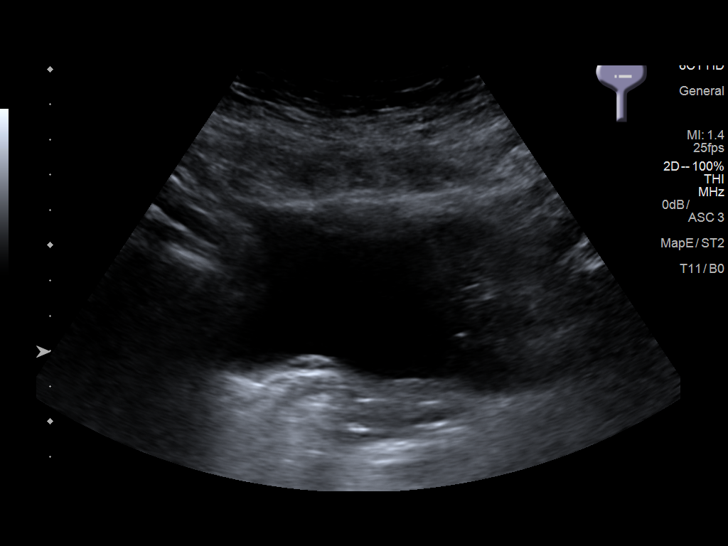
[im 32/32]
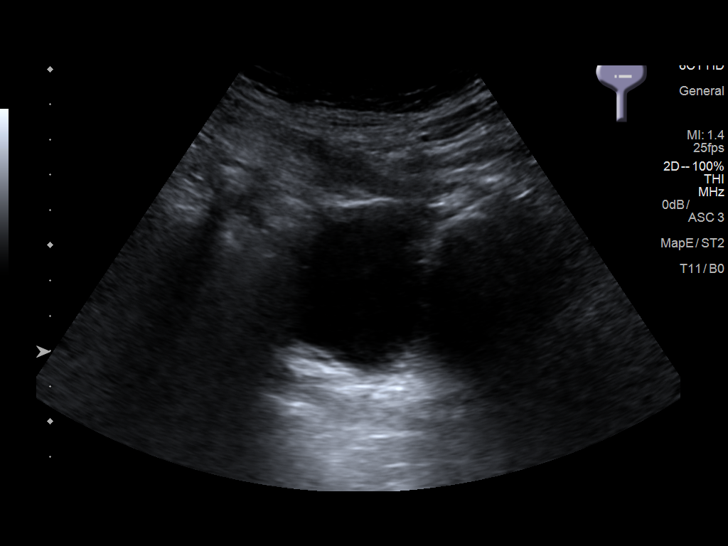

[14 of 25 positions shown; findings below may reference images not displayed]

FINDINGS: Right Kidney:

Length: 9.3 cm. Echogenicity within normal limits. No mass or
hydronephrosis visualized.

Left Kidney:

Length: 9.8 cm. Echogenicity within normal limits. No mass or
hydronephrosis visualized.

Bladder:

Appears normal for degree of bladder distention.
IMPRESSION: No cause for acute renal insufficiency identified.

## 2016-11-10 ENCOUNTER — Encounter: Payer: Self-pay | Admitting: Pediatric Intensive Care

## 2016-11-18 NOTE — Congregational Nurse Program (Signed)
Congregational Nurse Program Note  Date of Encounter: 10/16/2016  Past Medical History: Past Medical History:  Diagnosis Date  . Anxiety   . Arthritis   . Chronic back pain   . Chronic knee pain   . Cocaine abuse   . High cholesterol   . Hypertension   . Joint pain   . Trichomoniasis 12/29/2015    Encounter Details: Client [place] new to [place]. Has been been in [place] but used cocaine and heroin 3 days ago. Client states she is currently withdrawing with moderately severe symptoms. She has ED prescriptions for clonidine and phenergan. CN referred client [facility], gave [facility]. Client will follow up in CN clinic for BP checks and for assistance to establish PCP.

## 2016-11-19 NOTE — Congregational Nurse Program (Signed)
Congregational Nurse Program Note  Date of Encounter: 10/16/2016  Past Medical History: Past Medical History:  Diagnosis Date  . Anxiety   . Arthritis   . Chronic back pain   . Chronic knee pain   . Cocaine abuse   . High cholesterol   . Hypertension   . Joint pain   . Trichomoniasis 12/29/2015    Encounter Details:  [facility]   Is interested in transitional housing for substance abuse treatment.  States has not "used" for several days, and is determined to "stay clean".  Needs her ID, and medicaid.  Referred to social worker for assistance with obtaining ID and Medicaid.  [facility].

## 2016-11-19 NOTE — Congregational Nurse Program (Signed)
Congregational Nurse Program Note  Date of Encounter: 11/02/2016  Past Medical History: Past Medical History:  Diagnosis Date  . Anxiety   . Arthritis   . Chronic back pain   . Chronic knee pain   . Cocaine abuse   . High cholesterol   . Hypertension   . Joint pain   . Trichomoniasis 12/29/2015    Encounter Details:     CNP Questionnaire - 11/02/16 1811      Patient Demographics   Is this a new or existing patient? Existing   Patient is considered a/an Not Applicable   Race African-American/Black     Patient Assistance   Location of Patient Assistance Not Applicable   Patient's financial/insurance status Self-Pay (Uninsured);Low Income   Uninsured Patient ([place] Card/Care Connects) Yes   Interventions Assisted patient in making appt.;Appt. has been completed   Patient referred to apply for the following financial assistance [employer] Card/Care Connects Renewal   Food insecurities addressed Not Applicable   Transportation assistance Yes   Type of Assistance [employer] GroupBus Pass Given   Assistance securing medications No   Type of Assistance [facility]   Educational health offerings Navigating the healthcare system;Behavioral health     Encounter Details   Primary purpose of visit Navigating the Healthcare System;Spiritual Care/Support Visit   Was an Emergency Department visit averted? Not Applicable   Does patient have a medical provider? No   Patient referred to Clinic;Area Agency   Was a mental health screening completed? (GAINS tool) No   Does patient have dental issues? No   Does patient have vision issues? No   Does your patient have an abnormal blood pressure today? No   Since previous encounter, have you referred patient for abnormal blood pressure that resulted in a new diagnosis or medication change? No   Does your patient have an abnormal blood glucose today? No   Since previous encounter, have you referred patient for abnormal blood glucose that resulted in a new  diagnosis or medication change? No   Was there a life-saving intervention made? No     B/P 140/104.  Notified her provider, Lavinia SharpsMary Ann Placey NP.  In[facility].  Bus passes given

## 2016-11-19 NOTE — Congregational Nurse Program (Signed)
Congregational Nurse Program Note  Date of Encounter: 11/09/2016  Past Medical History: Past Medical History:  Diagnosis Date  . Anxiety   . Arthritis   . Chronic back pain   . Chronic knee pain   . Cocaine abuse   . High cholesterol   . Hypertension   . Joint pain   . Trichomoniasis 12/29/2015    Encounter Details:     CNP Questionnaire - 11/09/16 1813      Patient Demographics   Is this a new or existing patient? Existing   Patient is considered a/an Not Applicable   Race African-American/Black     Patient Assistance   Location of Patient Assistance Not Applicable   Patient's financial/insurance status Self-Pay (Uninsured);Low Income   Uninsured Patient ([place] Card/Care Connects) Yes   Interventions Assisted patient in making appt.;Appt. has been completed   Patient referred to apply for the following financial assistance [employer] Card/Care Connects Renewal   Food insecurities addressed Not Applicable   Transportation assistance Yes   Type of Assistance [employer] GroupBus Pass Given   Assistance securing medications No   Type of Assistance [facility]   Educational health offerings Navigating the healthcare system;Behavioral health     Encounter Details   Primary purpose of visit Navigating the Healthcare System;Spiritual Care/Support Visit   Was an Emergency Department visit averted? Not Applicable   Does patient have a medical provider? No   Patient referred to Clinic;Area Agency   Was a mental health screening completed? (GAINS tool) No   Does patient have dental issues? No   Does patient have vision issues? No   Does your patient have an abnormal blood pressure today? No   Since previous encounter, have you referred patient for abnormal blood pressure that resulted in a new diagnosis or medication change? No   Does your patient have an abnormal blood glucose today? No   Since previous encounter, have you referred patient for abnormal blood glucose that resulted in a new  diagnosis or medication change? No   Was there a life-saving intervention made? No     B/P 146/104.  Instructed to see her provider.  Buss passes provided.  Client reports does smoke.  Encouraged client to consider decreasing number of cigarettes smoked per day

## 2016-11-19 NOTE — Congregational Nurse Program (Signed)
Congregational Nurse Program Note  Date of Encounter: 10/26/2016  Past Medical History: Past Medical History:  Diagnosis Date  . Anxiety   . Arthritis   . Chronic back pain   . Chronic knee pain   . Cocaine abuse   . High cholesterol   . Hypertension   . Joint pain   . Trichomoniasis 12/29/2015    Encounter Details:     CNP Questionnaire - 10/26/16 1808      Patient Demographics   Is this a new or existing patient? Existing   Patient is considered a/an Not Applicable   Race African-American/Black     Patient Assistance   Location of Patient Assistance Not Applicable   Patient's financial/insurance status Self-Pay (Uninsured);Low Income   Uninsured Patient ([place] Card/Care Connects) Yes   Interventions Assisted patient in making appt.   Patient referred to apply for the following financial assistance [employer] Card/Care Connects Renewal   Food insecurities addressed Not Applicable   Transportation assistance Yes   Type of Assistance Bus Pass Given   Assistance securing medications No   Educational health offerings Navigating the healthcare system;Behavioral health     Encounter Details   Primary purpose of visit Navigating the Healthcare System;Spiritual Care/Support Visit   Was an Emergency Department visit averted? Not Applicable   Does patient have a medical provider? No   Patient referred to Clinic;Area Agency   Was a mental health screening completed? (GAINS tool) No   Does patient have dental issues? No   Does patient have vision issues? No   Does your patient have an abnormal blood pressure today? No   Since previous encounter, have you referred patient for abnormal blood pressure that resulted in a new diagnosis or medication change? No   Does your patient have an abnormal blood glucose today? No   Since previous encounter, have you referred patient for abnormal blood glucose that resulted in a new diagnosis or medication change? No   Was there a life-saving  intervention made? No     B/p 130/94.  Requested bus passes to obtain ID.

## 2016-11-25 NOTE — Congregational Nurse Program (Signed)
Congregational Nurse Program Note  Date of Encounter: 10/20/2016  Past Medical History: Past Medical History:  Diagnosis Date  . Anxiety   . Arthritis   . Chronic back pain   . Chronic knee pain   . Cocaine abuse   . High cholesterol   . Hypertension   . Joint pain   . Trichomoniasis 12/29/2015    Encounter Details:     CNP Questionnaire - 11/09/16 1813      Patient Demographics   Is this a new or existing patient? Existing   Patient is considered a/an Not Applicable   Race African-American/Black     Patient Assistance   Location of Patient Assistance Not Applicable   Patient's financial/insurance status Self-Pay (Uninsured);Low Income   Uninsured Patient ([place] Card/Care Connects) Yes   Interventions Assisted patient in making appt.;Appt. has been completed   Patient referred to apply for the following financial assistance [employer] Card/Care Connects Renewal   Food insecurities addressed Not Applicable   Transportation assistance Yes   Type of Assistance [employer] GroupBus Pass Given   Assistance securing medications No   Type of Assistance [facility]   Educational health offerings Navigating the healthcare system;Behavioral health     Encounter Details   Primary purpose of visit Navigating the Healthcare System;Spiritual Care/Support Visit   Was an Emergency Department visit averted? Not Applicable   Does patient have a medical provider? No   Patient referred to Clinic;Area Agency   Was a mental health screening completed? (GAINS tool) No   Does patient have dental issues? No   Does patient have vision issues? No   Does your patient have an abnormal blood pressure today? No   Since previous encounter, have you referred patient for abnormal blood pressure that resulted in a new diagnosis or medication change? No   Does your patient have an abnormal blood glucose today? No   Since previous encounter, have you referred patient for abnormal blood glucose that resulted in a new  diagnosis or medication change? No   Was there a life-saving intervention made? No    BP check. [facility] she needs to go back on BP meds. Bus passes given.

## 2016-11-26 NOTE — Congregational Nurse Program (Signed)
Congregational Nurse Program Note  Date of Encounter: 10/23/2016  Past Medical History: Past Medical History:  Diagnosis Date  . Anxiety   . Arthritis   . Chronic back pain   . Chronic knee pain   . Cocaine abuse   . High cholesterol   . Hypertension   . Joint pain   . Trichomoniasis 12/29/2015    Encounter Details:     CNP Questionnaire - 11/09/16 1813      Patient Demographics   Is this a new or existing patient? Existing   Patient is considered a/an Not Applicable   Race African-American/Black     Patient Assistance   Location of Patient Assistance Not Applicable   Patient's financial/insurance status Self-Pay (Uninsured);Low Income   Uninsured Patient ([place] Card/Care Connects) Yes   Interventions Assisted patient in making appt.;Appt. has been completed   Patient referred to apply for the following financial assistance [employer] Card/Care Connects Renewal   Food insecurities addressed Not Applicable   Transportation assistance Yes   Type of Assistance [employer] GroupBus Pass Given   Assistance securing medications No   Type of Assistance [facility]   Educational health offerings Navigating the healthcare system;Behavioral health     Encounter Details   Primary purpose of visit Navigating the Healthcare System;Spiritual Care/Support Visit   Was an Emergency Department visit averted? Not Applicable   Does patient have a medical provider? No   Patient referred to Clinic;Area Agency   Was a mental health screening completed? (GAINS tool) No   Does patient have dental issues? No   Does patient have vision issues? No   Does your patient have an abnormal blood pressure today? No   Since previous encounter, have you referred patient for abnormal blood pressure that resulted in a new diagnosis or medication change? No   Does your patient have an abnormal blood glucose today? No   Since previous encounter, have you referred patient for abnormal blood glucose that resulted in a new  diagnosis or medication change? No   Was there a life-saving intervention made? No    [facility]

## 2016-11-29 NOTE — Congregational Nurse Program (Signed)
Congregational Nurse Program Note  Date of Encounter: 11/02/2016  Past Medical History: Past Medical History:  Diagnosis Date  . Anxiety   . Arthritis   . Chronic back pain   . Chronic knee pain   . Cocaine abuse   . High cholesterol   . Hypertension   . Joint pain   . Trichomoniasis 12/29/2015    Encounter Details:     CNP Questionnaire - 11/09/16 1813      Patient Demographics   Is this a new or existing patient? Existing   Patient is considered a/an Not Applicable   Race African-American/Black     Patient Assistance   Location of Patient Assistance Not Applicable   Patient's financial/insurance status Self-Pay (Uninsured);Low Income   Uninsured Patient ([place] Card/Care Connects) Yes   Interventions Assisted patient in making appt.;Appt. has been completed   Patient referred to apply for the following financial assistance [employer] insecurities addressed Not Applicable   Transportation assistance Yes   Type of Assistance [employer] Given   Assistance securing medications No   Type of Assistance [facility]   Educational health offerings Navigating the healthcare system;Behavioral health     Encounter Details   Primary purpose of visit Navigating the Healthcare System;Spiritual Care/Support Visit   Was an Emergency Department visit averted? Not Applicable   Does patient have a medical provider? No   Patient referred to Clinic;Area Agency   Was a mental health screening completed? (GAINS tool) No   Does patient have dental issues? No   Does patient have vision issues? No   Does your patient have an abnormal blood pressure today? No   Since previous encounter, have you referred patient for abnormal blood pressure that resulted in a new diagnosis or medication change? No   Does your patient have an abnormal blood glucose today? No   Since previous encounter, have you referred patient for abnormal blood glucose that resulted in a new  diagnosis or medication change? No   Was there a life-saving intervention made? No     Client needs bus passes to get to [facility] clinic.

## 2016-11-30 NOTE — Congregational Nurse Program (Signed)
Congregational Nurse Program Note  Date of Encounter: 11/10/2016  Past Medical History: Past Medical History:  Diagnosis Date  . Anxiety   . Arthritis   . Chronic back pain   . Chronic knee pain   . Cocaine abuse   . High cholesterol   . Hypertension   . Joint pain   . Trichomoniasis 12/29/2015    Encounter Details:     CNP Questionnaire - 11/10/16 1030      Patient Demographics   Is this a new or existing patient? Existing   Patient is considered a/an Not Applicable   Race African-American/Black     Patient Assistance   Location of Patient Assistance [facility]   Patient's financial/insurance status Self-Pay (Uninsured);Low Income   Uninsured Patient ([employer] officer, trade union) Yes   Interventions Appt. has been completed   Patient referred to apply for the following financial assistance [employer] insecurities addressed Not Applicable   Transportation assistance Yes   Type of Assistance [employer] Given   Assistance securing medications No   Type of Assistance Other   Educational health offerings Hypertension     Encounter Details   Primary purpose of visit Education/Health Concerns;Post PCP Visit   Was an Emergency Department visit averted? Not Applicable   Does patient have a medical provider? No   Patient referred to Clinic;Establish PCP   Was a mental health screening completed? (GAINS tool) No   Does patient have dental issues? No   Does patient have vision issues? No   Does your patient have an abnormal blood pressure today? No   Since previous encounter, have you referred patient for abnormal blood pressure that resulted in a new diagnosis or medication change? No   Does your patient have an abnormal blood glucose today? No   Since previous encounter, have you referred patient for abnormal blood glucose that resulted in a new diagnosis or medication change? No   Was there a life-saving intervention made? No     BP check- client needs bus  passes for [facility] clinic.

## 2016-12-04 ENCOUNTER — Encounter: Payer: Self-pay | Admitting: Pediatric Intensive Care

## 2016-12-04 NOTE — Congregational Nurse Program (Signed)
Congregational Nurse Program Note  Date of Encounter: 12/04/2016  Past Medical History: Past Medical History:  Diagnosis Date  . Anxiety   . Arthritis   . Chronic back pain   . Chronic knee pain   . Cocaine abuse   . High cholesterol   . Hypertension   . Joint pain   . Trichomoniasis 12/29/2015    Encounter Details:     CNP Questionnaire - 12/04/16 0900      Patient Demographics   Is this a new or existing patient? Existing   Patient is considered a/an Not Applicable   Race African-American/Black     Patient Assistance   Location of Patient Assistance [facility]   Patient's financial/insurance status Self-Pay (Uninsured);Low Income   Uninsured Patient ([place] Card/Care Connects) Yes   Interventions Assisted patient in making appt.   Patient referred to apply for the following financial assistance [employer] insecurities addressed Not Technical brewer Yes   Type of Assistance [employer] Given   Assistance securing medications Yes   Type of Assistance [employer] Department   Educational health offerings Safety;Navigating the healthcare system;Hypertension;Behavioral health     Encounter Details   Primary purpose of visit Chronic Illness/Condition Visit;Education/Health Concerns;Safety   Was an Emergency Department visit averted? Not Applicable   Does patient have a medical provider? Yes   Patient referred to Area Agency;Follow up with established PCP   Was a mental health screening completed? (GAINS tool) No   Does patient have dental issues? No   Does patient have vision issues? No   Does your patient have an abnormal blood pressure today? No   Since previous encounter, have you referred patient for abnormal blood pressure that resulted in a new diagnosis or medication change? No   Does your patient have an abnormal blood glucose today? No   Since previous encounter, have you referred patient for abnormal blood glucose  that resulted in a new diagnosis or medication change? No   Was there a life-saving intervention made? 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 Safety plan- bus passes for [facility] intake today. Client will stay at daughter's house this weekend. Client will have clinic check in Monday AM with ADs paperwork. Client will see Yetta Glassman nurse on Monday. CN Evans recommends client complete forms for [employer]. This CN left message with [facility] pharmacy as client is running low on BP meds.

## 2016-12-10 NOTE — Congregational Nurse Program (Signed)
Congregational Nurse Program Note  Date of Encounter: 12/09/2016  Past Medical History: Past Medical History:  Diagnosis Date  . Anxiety   . Arthritis   . Chronic back pain   . Chronic knee pain   . Cocaine abuse   . High cholesterol   . Hypertension   . Joint pain   . Trichomoniasis 12/29/2015    Encounter Details:     CNP Questionnaire - 12/10/16 1701      Patient Demographics   Is this a new or existing patient? Existing   Patient is considered a/an Not Applicable   Race African-American/Black     Patient Assistance   Location of Patient Assistance [facility]   Patient's financial/insurance status Self-Pay (Uninsured);Low Income   Uninsured Patient ([place] Card/Care Connects) Yes   Interventions Assisted patient in making appt.   Patient referred to apply for the following financial assistance [employer] insecurities addressed Not Technical brewer No   Assistance securing medications No   Type of Assistance [employer] Department   Educational health offerings Safety;Navigating the healthcare system;Hypertension;Behavioral health     Encounter Details   Primary purpose of visit Chronic Illness/Condition Visit;Education/Health Concerns;Safety   Was an Emergency Department visit averted? Not Applicable   Does patient have a medical provider? Yes   Patient referred to Area Agency;Follow up with established PCP   Was a mental health screening completed? (GAINS tool) No   Does patient have dental issues? No   Does patient have vision issues? No   Does your patient have an abnormal blood pressure today? No   Since previous encounter, have you referred patient for abnormal blood pressure that resulted in a new diagnosis or medication change? No   Does your patient have an abnormal blood glucose today? No   Since previous encounter, have you referred patient for abnormal blood glucose that resulted in a new diagnosis or  medication change? No   Was there a life-saving intervention made? No     Requesting assistance with transitional housing.  Provided list of [place] [facility] in [place] area.  Client did not go to [facility], stating the "wait was too long".  Encouraged her to return.

## 2016-12-10 NOTE — Congregational Nurse Program (Signed)
Congregational Nurse Program Note  Date of Encounter: 11/16/2016  Past Medical History: Past Medical History:  Diagnosis Date  . Anxiety   . Arthritis   . Chronic back pain   . Chronic knee pain   . Cocaine abuse   . High cholesterol   . Hypertension   . Joint pain   . Trichomoniasis 12/29/2015    Encounter Details:     CNP Questionnaire - 12/04/16 0900      Patient Demographics   Is this a new or existing patient? Existing   Patient is considered a/an Not Applicable   Race African-American/Black     Patient Assistance   Location of Patient Assistance [facility]   Patient's financial/insurance status Self-Pay (Uninsured);Low Income   Uninsured Patient ([place] Card/Care Connects) Yes   Interventions Assisted patient in making appt.   Patient referred to apply for the following financial assistance [employer] insecurities addressed Not Technical brewer Yes   Type of Assistance [employer] Given   Assistance securing medications Yes   Type of Assistance [employer] Department   Educational health offerings Safety;Navigating the healthcare system;Hypertension;Behavioral health     Encounter Details   Primary purpose of visit Chronic Illness/Condition Visit;Education/Health Concerns;Safety   Was an Emergency Department visit averted? Not Applicable   Does patient have a medical provider? Yes   Patient referred to Area Agency;Follow up with established PCP   Was a mental health screening completed? (GAINS tool) No   Does patient have dental issues? No   Does patient have vision issues? No   Does your patient have an abnormal blood pressure today? No   Since previous encounter, have you referred patient for abnormal blood pressure that resulted in a new diagnosis or medication change? No   Does your patient have an abnormal blood glucose today? No   Since previous encounter, have you referred patient for abnormal blood glucose  that resulted in a new diagnosis or medication change? No   Was there a life-saving intervention made? No     Requested bus passes to get blood work completed.  Two bus passes provided. B/P 120/80

## 2016-12-10 NOTE — Congregational Nurse Program (Signed)
Congregational Nurse Program Note  Date of Encounter: 11/23/2016  Past Medical History: Past Medical History:  Diagnosis Date  . Anxiety   . Arthritis   . Chronic back pain   . Chronic knee pain   . Cocaine abuse   . High cholesterol   . Hypertension   . Joint pain   . Trichomoniasis 12/29/2015    Encounter Details:     CNP Questionnaire - 12/04/16 0900      Patient Demographics   Is this a new or existing patient? Existing   Patient is considered a/an Not Applicable   Race African-American/Black     Patient Assistance   Location of Patient Assistance [facility]   Patient's financial/insurance status Self-Pay (Uninsured);Low Income   Uninsured Patient ([place]/[employer]s) Yes   Interventions Assisted patient in making appt.   Patient referred to apply for the following financial assistance [place] [employer] insecurities addressed Not Technical brewer Yes   Type of Assistance [employer] Given   Assistance securing medications Yes   Type of Assistance [employer]   Educational health offerings Safety;Navigating the healthcare system;Hypertension;Behavioral health     Encounter Details   Primary purpose of visit Chronic Illness/Condition Visit;Education/Health Concerns;Safety   Was an Emergency Department visit averted? Not Applicable   Does patient have a medical provider? Yes   Patient referred to Area Agency;Follow up with established PCP   Was a mental health screening completed? (GAINS tool) No   Does patient have dental issues? No   Does patient have vision issues? No   Does your patient have an abnormal blood pressure today? No   Since previous encounter, have you referred patient for abnormal blood pressure that resulted in a new diagnosis or medication change? No   Does your patient have an abnormal blood glucose today? No   Since previous encounter, have you referred patient for abnormal blood glucose  that resulted in a new diagnosis or medication change? No   Was there a life-saving intervention made? No     Requested information about smoking cessation.  Information given.  Also discussed need to go to [facility] for substance abuse treatment.  Bus passes given to go to [facility]

## 2016-12-28 ENCOUNTER — Encounter: Payer: Self-pay | Admitting: Pediatric Intensive Care

## 2017-01-21 NOTE — Congregational Nurse Program (Signed)
Congregational Nurse Program Note  Date of Encounter: 12/28/2016  Past Medical History: Past Medical History:  Diagnosis Date  . Anxiety   . Arthritis   . Chronic back pain   . Chronic knee pain   . Cocaine abuse   . High cholesterol   . Hypertension   . Joint pain   . Trichomoniasis 12/29/2015    Encounter Details: CNP Questionnaire - 12/28/16 1100      Questionnaire   Patient Status  Not Applicable    Race  Black or African American    Location [facility] Served At  [facility]    Insurance  Not Applicable    Uninsured  Uninsured (NEW 1x/quarter)    Food  No food insecurities    Housing/Utilities  No permanent housing    Transportation  Yes, need transportation assistance;Within past 12 months, lack of transportation negatively impacted life    Interpersonal Safety  Yes, feel physically and emotionally safe where you currently live    Medication  Yes, have medication insecurities;Provided medication assistance    Medical Provider  Yes    Referrals  Primary Care Provider/Clinic    ED Visit Averted  Not Applicable    Life-Saving Intervention Made  Not Applicable      BP check. Client states she has not had medication in 4 days. CN contacted [facility]- she has medication on record but has not refills at present. CN called [facility]. Client was no show for last appointment. CN advised follow through with PCP in order to keep refills current. Client will follow up with PCP and [facility] clinic as needed.

## 2017-11-07 IMAGING — DX DG CHEST 2V
2 series · 2 of 2 positions shown · non-contrast
Comparison: None.

CLINICAL DATA: Wheezing.  Chest pain and cough.

EXAM:
CHEST  2 VIEW

[chest lat]
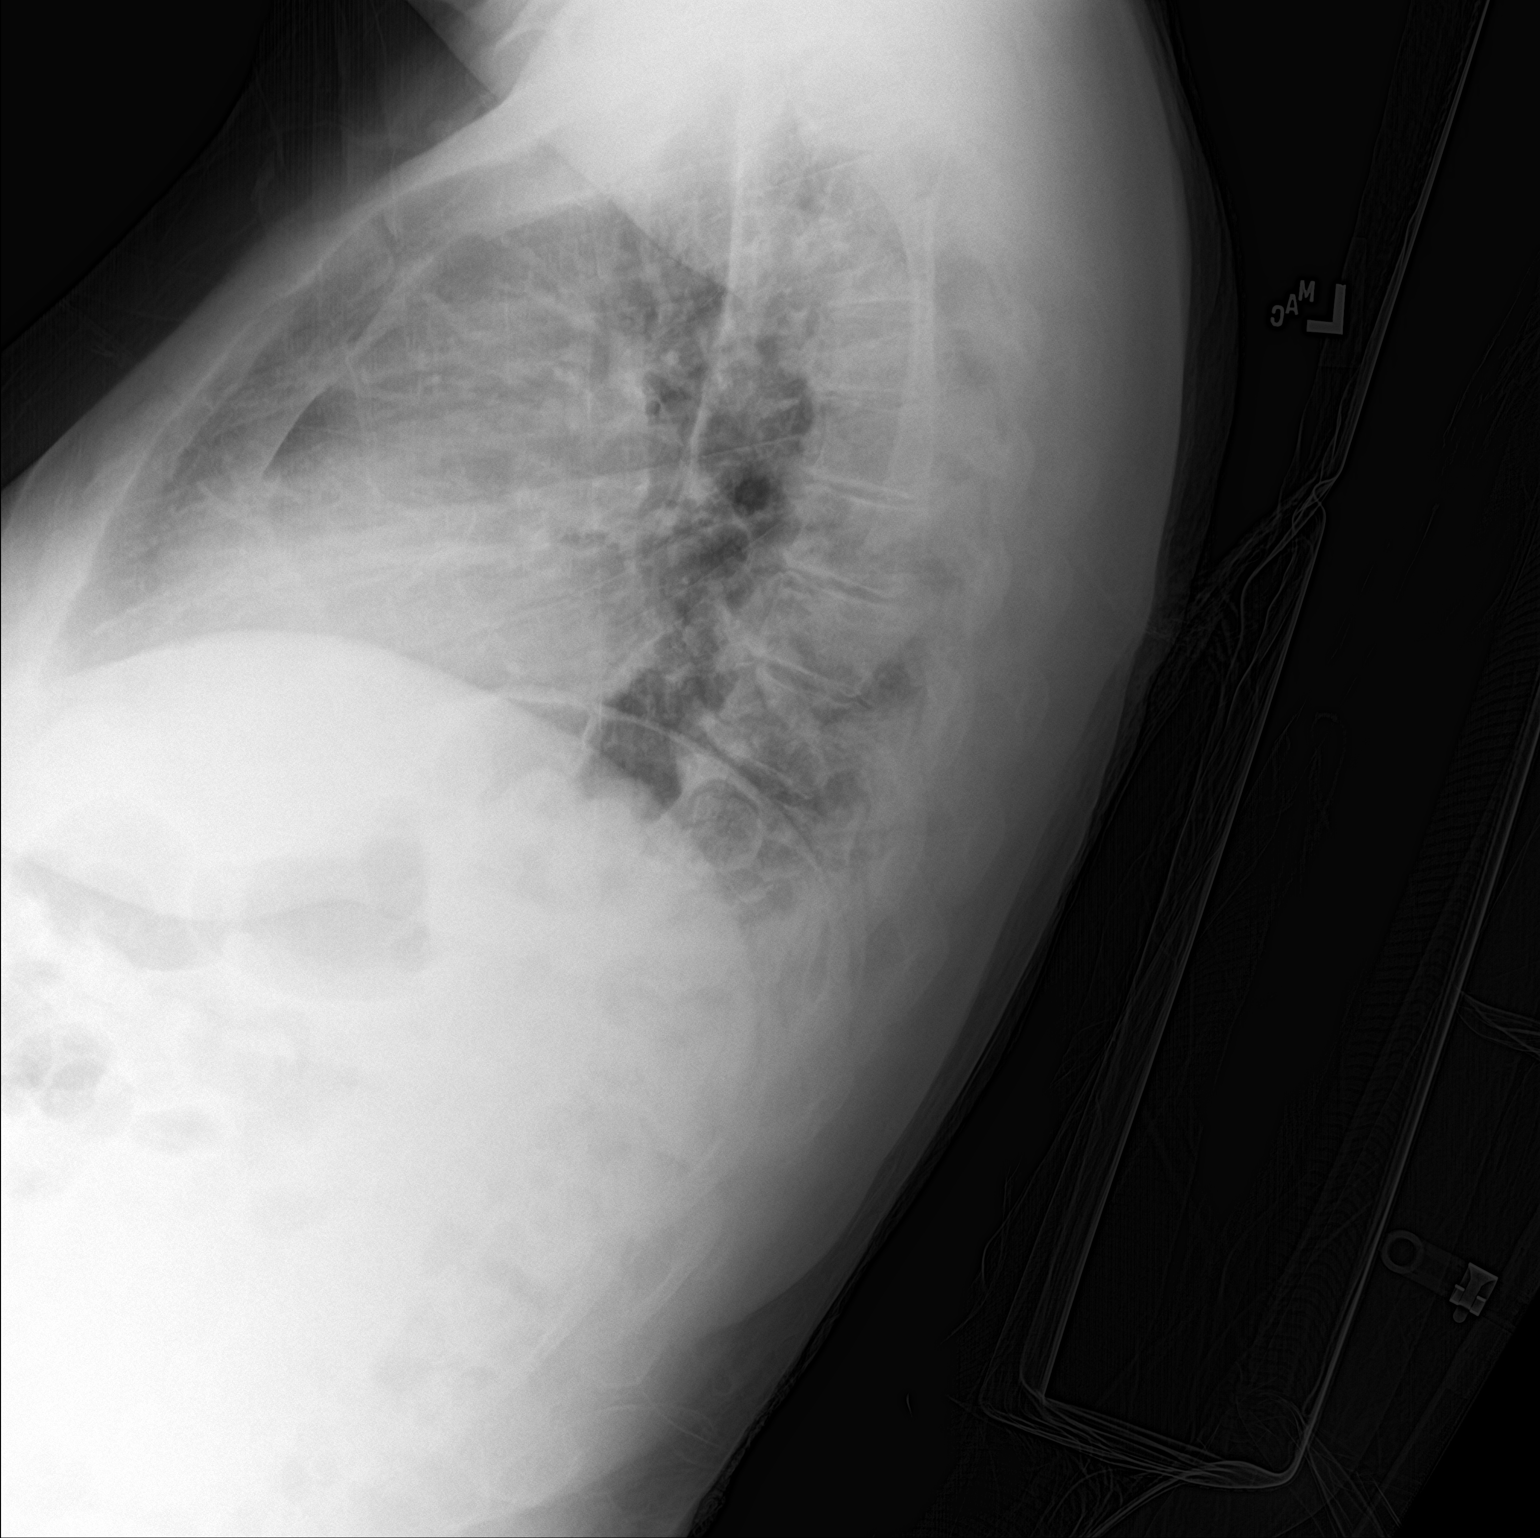

[chest ap]
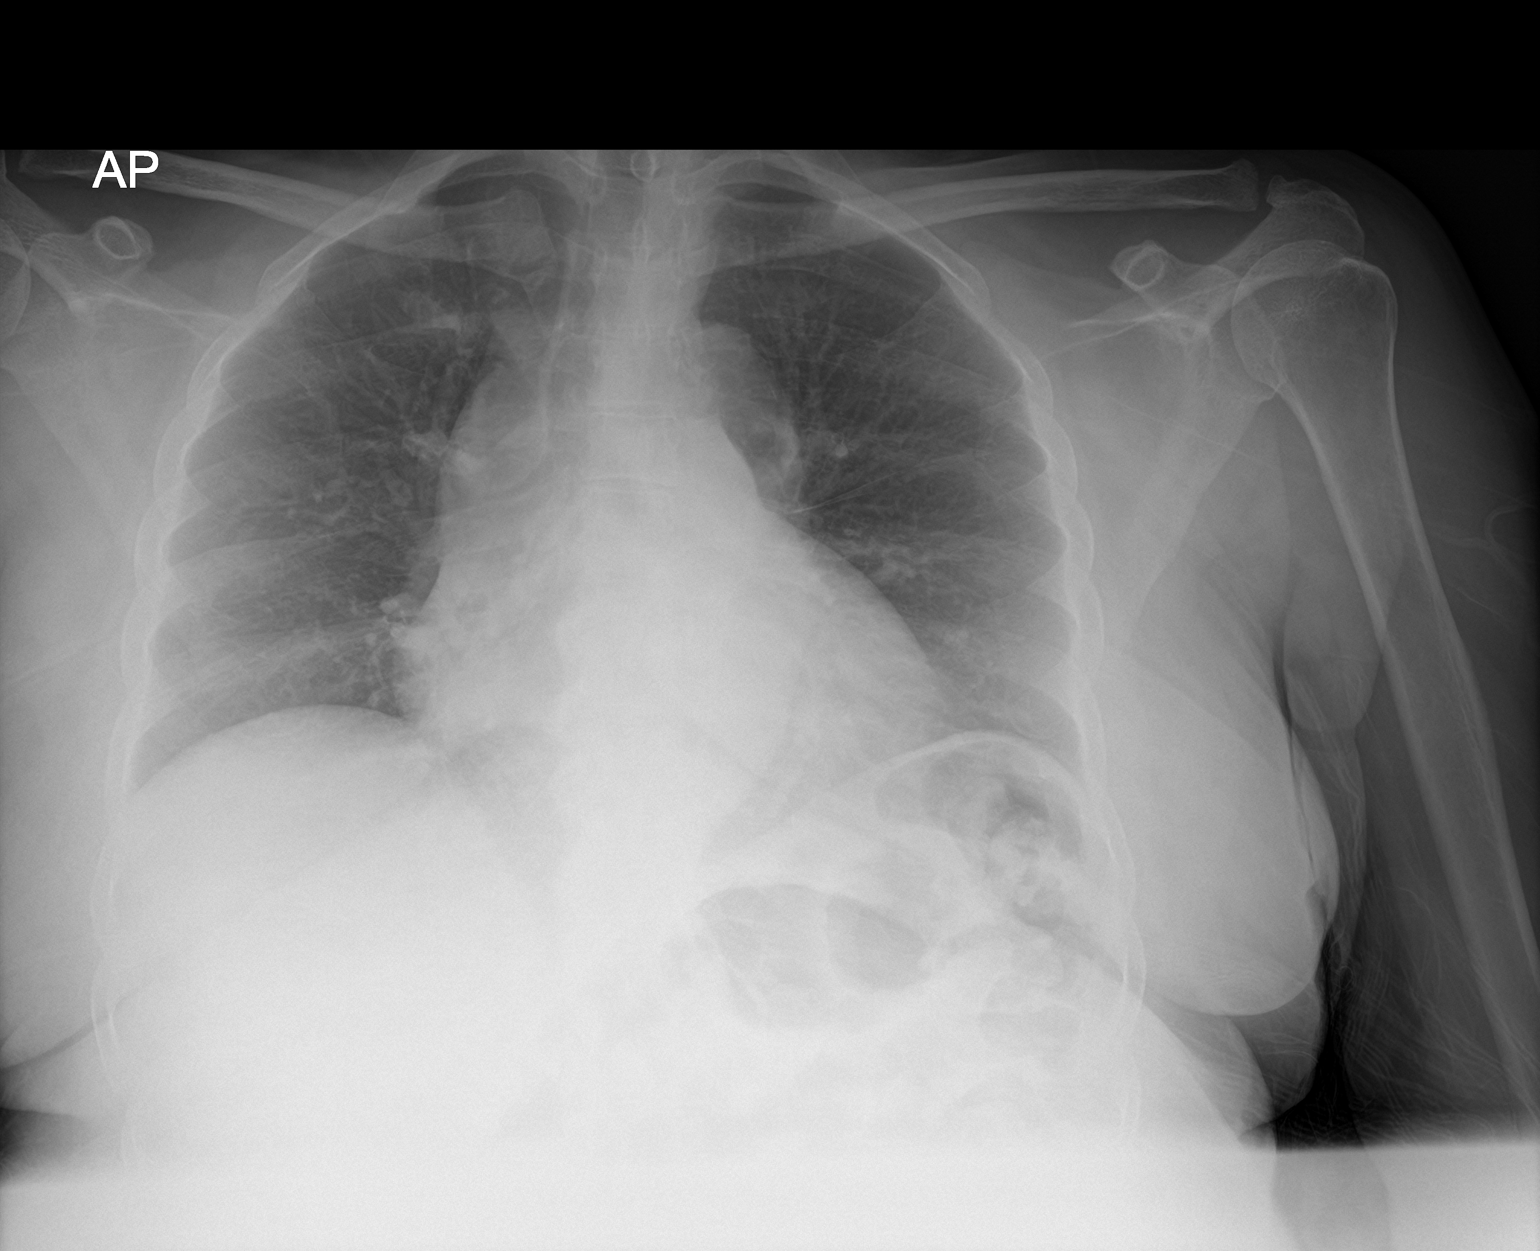

[2 of 2 positions shown; findings below may reference images not displayed]

FINDINGS: Lung volumes are low. Prominence of the cardiac silhouette likely
accentuated by low lung volumes. There is tortuosity of the thoracic
aorta. No focal airspace disease, pulmonary edema or pleural fluid.
No pneumothorax. No acute osseous abnormality is seen.
IMPRESSION: Hypoventilatory chest. Prominent cardiac silhouette is likely
accentuated by low lung volumes.

## 2017-11-07 IMAGING — CR DG KNEE COMPLETE 4+V*R*
4 series · 4 of 4 positions shown · non-contrast
Comparison: None.

CLINICAL DATA: History of arthritis, chronic knee pain

EXAM:
RIGHT KNEE - COMPLETE 4+ VIEW

[t knee ap right]
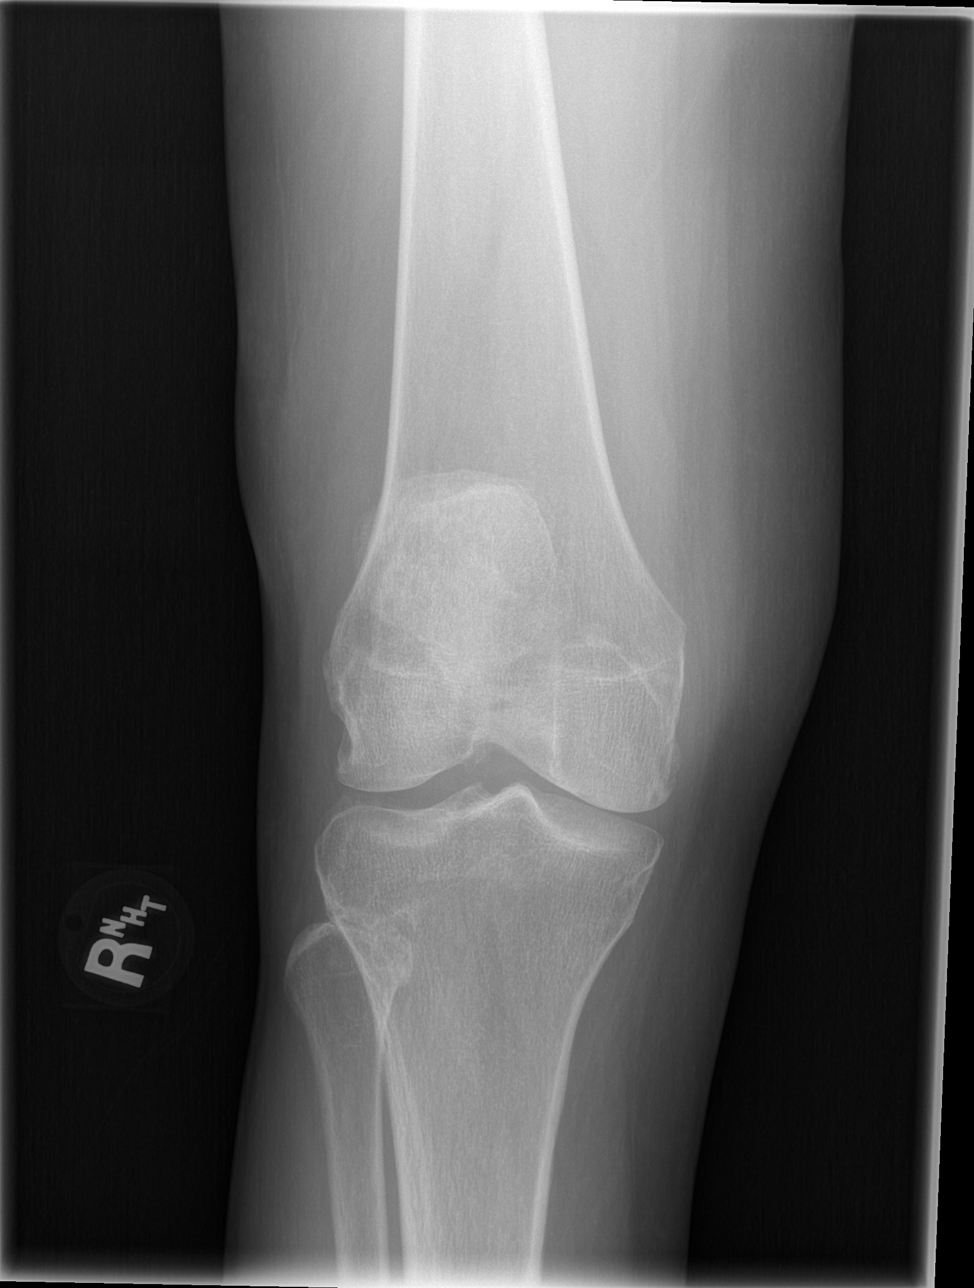

[t knee oblique right (1 of 2)]
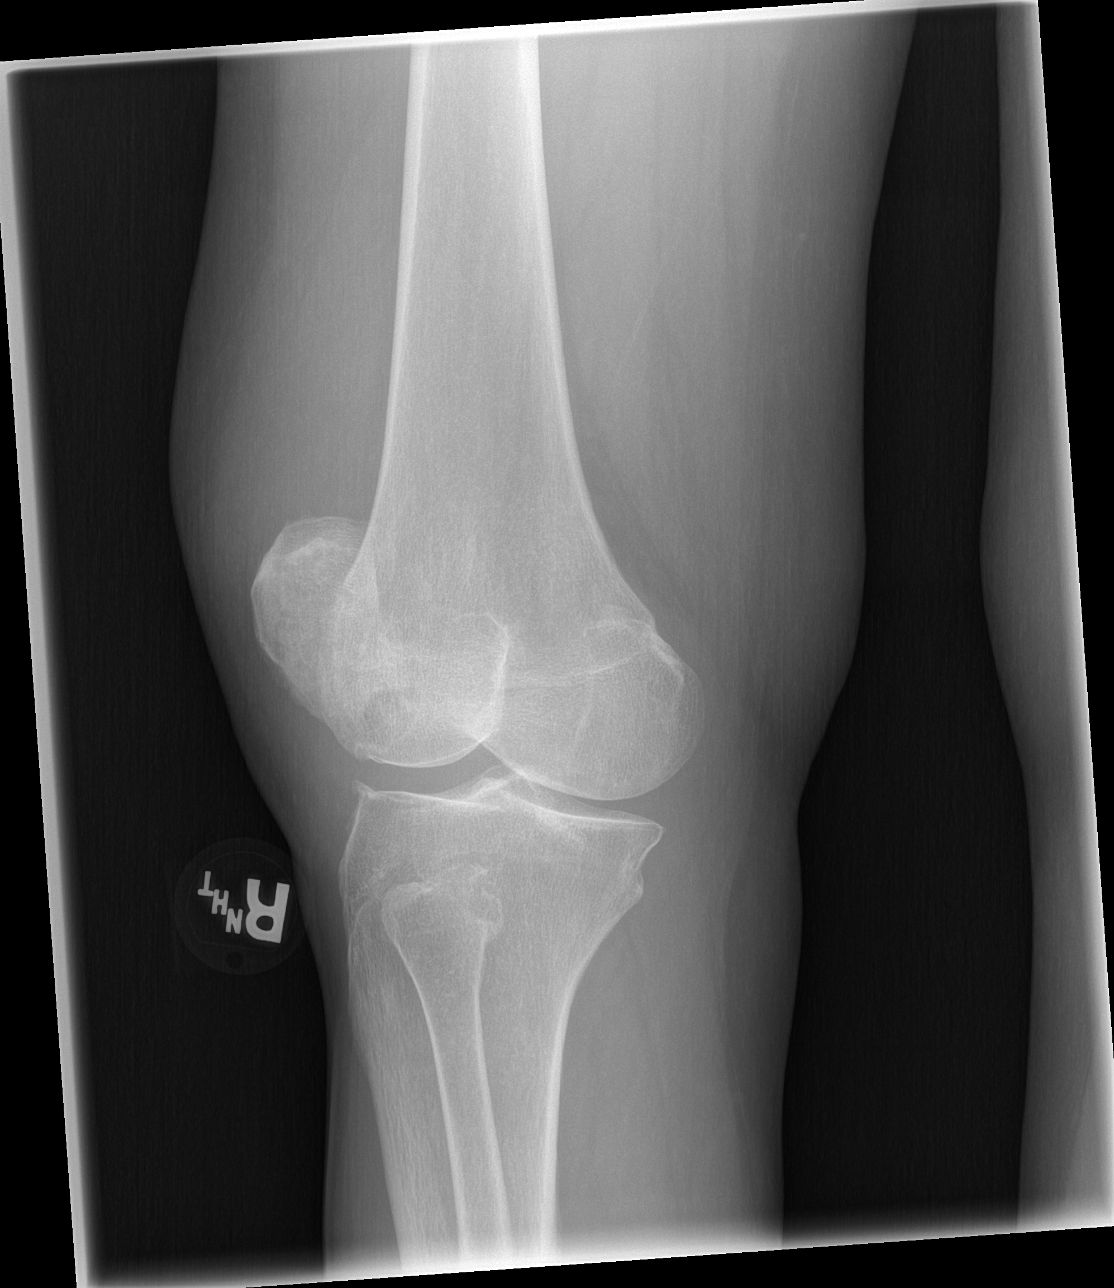

[t knee oblique right (2 of 2)]
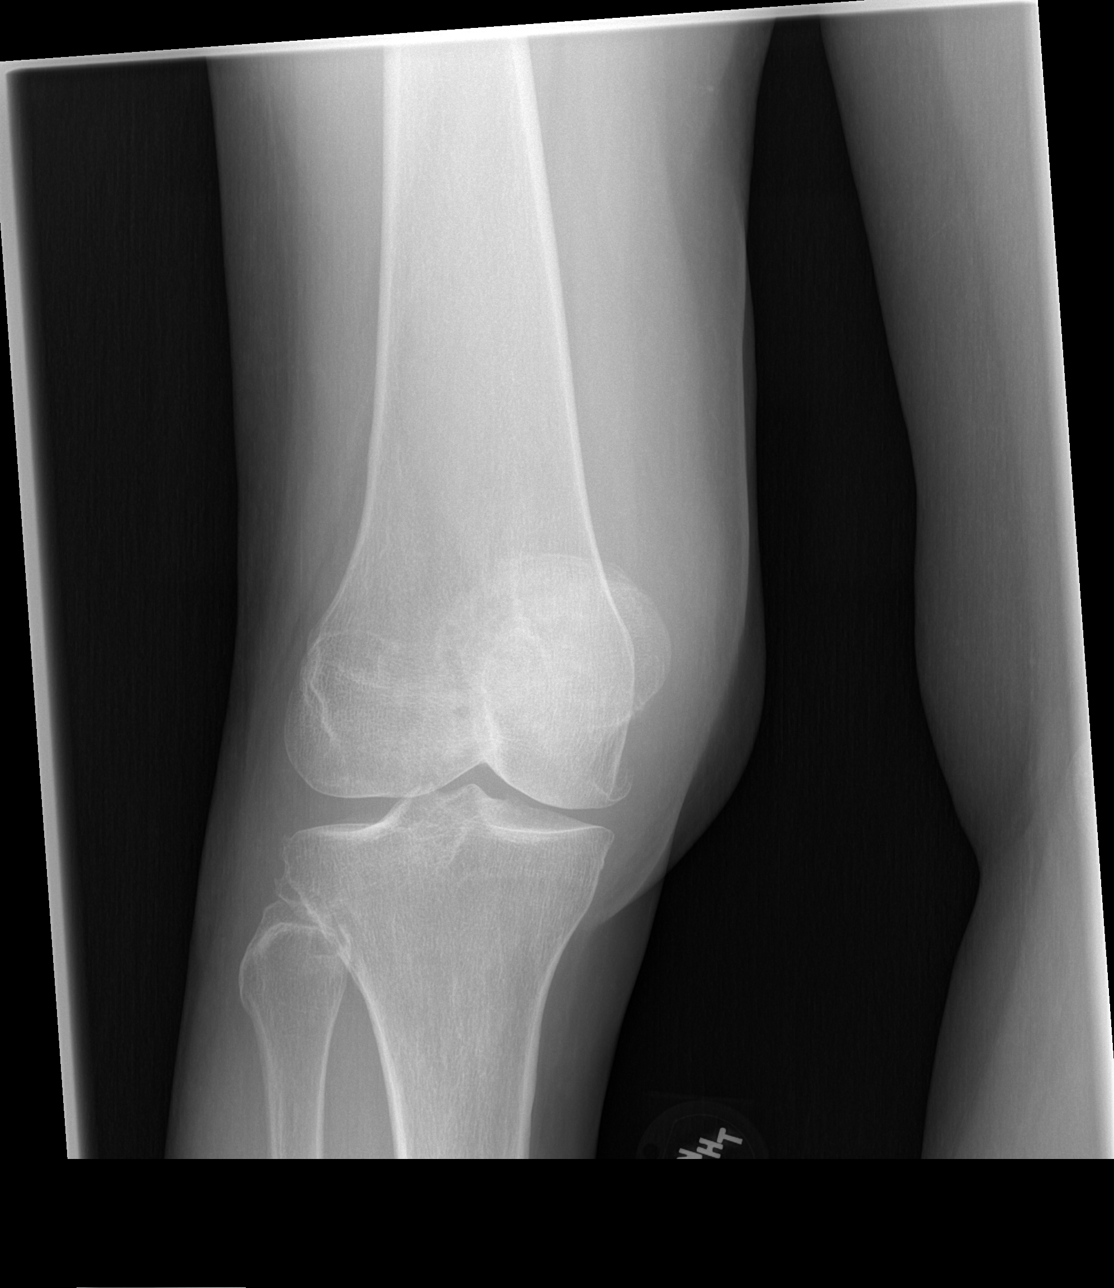

[t knee lat right]
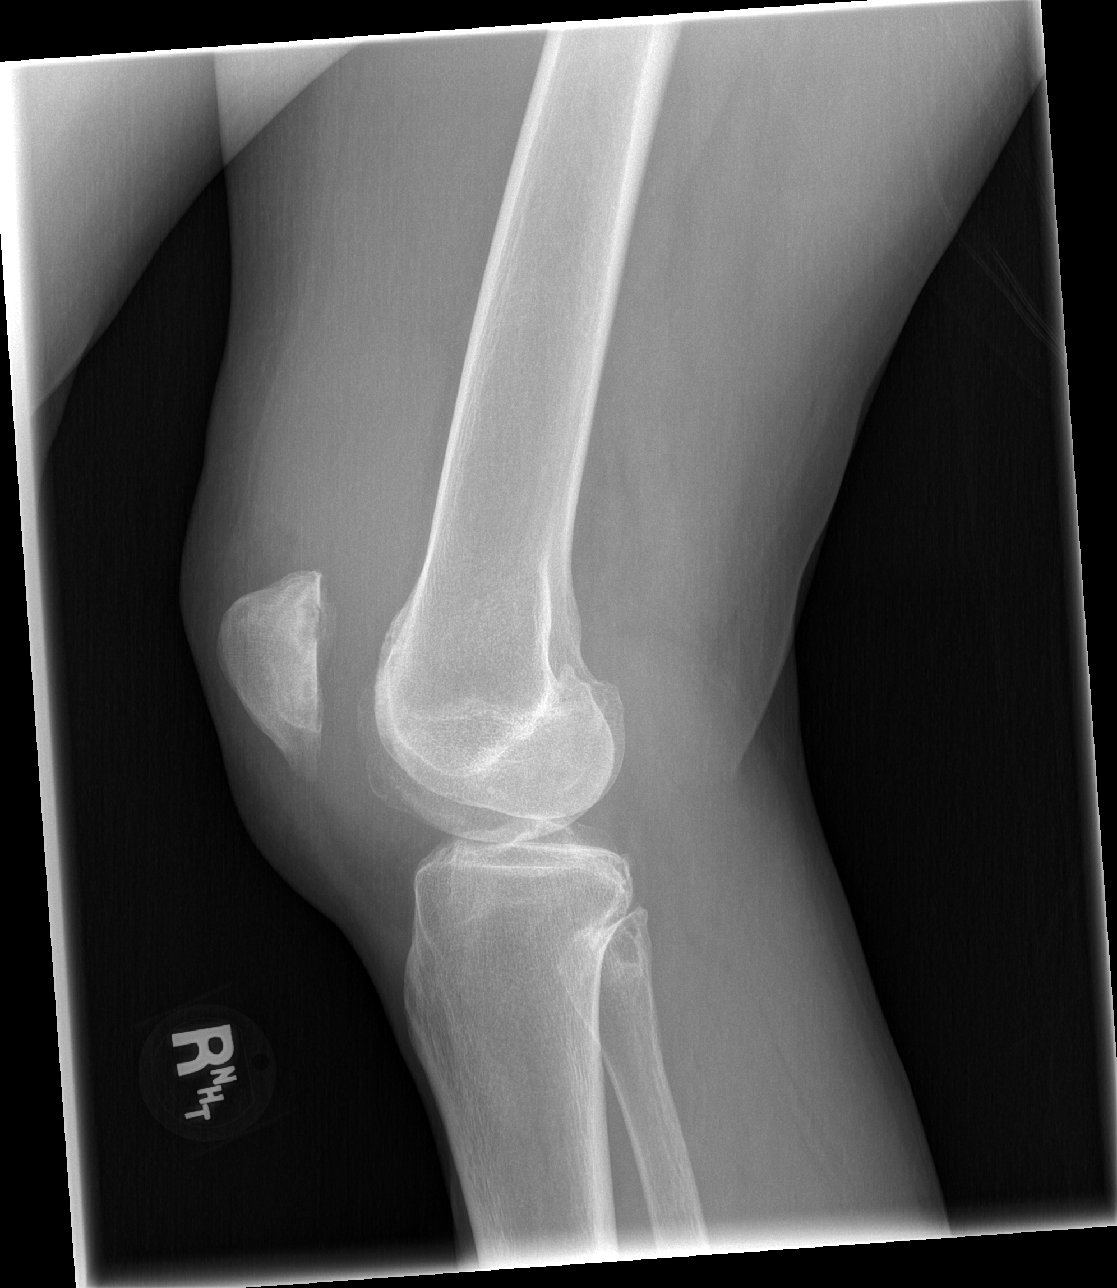

[4 of 4 positions shown; findings below may reference images not displayed]

FINDINGS: There is mild narrowing and spurring of the medial compartment. No
acute fracture or dislocation. Large suprapatellar joint effusion.
IMPRESSION: 1. No acute fracture.
2. Large suprapatellar joint effusion.

## 2021-08-28 ENCOUNTER — Ambulatory Visit (HOSPITAL_COMMUNITY)
Admission: EM | Admit: 2021-08-28 | Discharge: 2021-08-28 | Disposition: A | Discharge status: Home / Self Care | Payer: Commercial Managed Care - HMO

## 2021-08-28 DIAGNOSIS — F1190 Opioid use, unspecified, uncomplicated: Secondary | ICD-10-CM

## 2021-08-28 DIAGNOSIS — F1410 Cocaine abuse, uncomplicated: Secondary | ICD-10-CM

## 2021-08-28 NOTE — Discharge Instructions (Addendum)

## 2021-08-28 NOTE — ED Provider Notes (Signed)
Behavioral Health Urgent Care Medical Screening Exam  Patient Name: Amy Gibbs MRN: [id number] Date of Evaluation: 08/28/21 Chief Complaint:   Diagnosis:  Final diagnoses:  Cocaine abuse (HCC)  Opioid use disorder    History of Present illness: Amy Gibbs is a 56 y.o. female. Patient presents voluntarily to [facility] behavioral health for walk-in assessment.  Patient is accompanied by friend and emotional support provider, [name], who does not remain present during assessment.   Patient states "I have a drug problem and I am ready to stop, I am tired of doing drugs."  She reports she was recently released from [facility] related to an unintentional overdose.  [facility] suggested she should seek resources at [facility] behavioral.  She endorses ongoing polysubstance use for 30 years.  Drugs of choice include crack cocaine, uses "all day every day."  Last crack cocaine use on yesterday.  Additional drug of choice heroin, reports she is also uses  "all day every day."  Last use of heroin on yesterday.  She endorses using alcohol approximately 1-2 times per week, reports typically ingesting 1 drink.  Last alcohol use approximately 2 days ago.  Denies history of alcohol-related seizures or DTs.  Patient receives dialysis at [facility], dialysis scheduled for Tuesdays, Thursdays and Saturdays.  She does have an appointment for dialysis at 11:45 AM this morning.  She reports that time she is not compliant with dialysis and misses appointments related to substance use disorder.  She is followed by primary care provider and struggles with compliance of home medications including medications to treat elevated blood pressure.  Reviewed elevated blood pressure measurement today.  Patient reports she did not take her scheduled medications for several days.  Currently patient asymptomatic, no headache, no dizziness, no shortness of breath no chest pain.   She verbalizes plan to follow-up with primary care provider, verbalizes understanding of reasons to seek care in emergency department.  [name] is not currently linked with outpatient psychiatry.  Mental health history includes anxiety.  She denies history of inpatient psychiatric hospitalization.  No family mental health history reported.  Patient is assessed face-to-face by nurse practitioner.  She is seated in assessment area, no acute distress.  She is alert and oriented, pleasant and cooperative during assessment.  She presents with depressed mood, congruent affect. She denies suicidal and homicidal ideations.  She denies history of suicide attempts, denies history of non suicidal self-harm behavior.  She easily contracts verbally for safety with this Clinical research associate.  She has normal speech and behavior. She denies auditory and visual hallucinations currently. She endorses "seeing images of people" several weeks ago after using substances and remaining awake for several consecutive days.   Patient is able to converse coherently with goal-directed thoughts and no distractibility or preoccupation.  He denies paranoia.  Objectively there is no evidence of psychosis/mania or delusional thinking.  [name] is currently homeless in [place]. She typically sleeps at the homes of friends or the [employer]. She denies access to weapons. Patient endorses decreased sleep and appetite.  Patient offered support and encouragement. Reviewed strict return precautions, patient verbalizes understanding.   [name] is educated and verbalizes understanding of mental health resources and other crisis services in the community. She is instructed to call 911 and present to the nearest emergency room should she experience any suicidal/homicidal ideation, auditory/visual/hallucinations, or detrimental worsening of her mental health condition.      Psychiatric Specialty Exam  Presentation  General Appearance:Appropriate  for  Environment; Casual  Eye Contact:Good  Speech:Clear and Coherent; Normal Rate  Speech Volume:Normal  Handedness:Right   Mood and Affect  Mood:Depressed  Affect:Congruent; Depressed   Thought Process  Thought Processes:Coherent; Goal Directed; Linear  Descriptions of Associations:Intact  Orientation:Full (Time, Place and Person)  Thought Content:Logical; WDL    Hallucinations:None  Ideas of Reference:None  Suicidal Thoughts:No  Homicidal Thoughts:No   Sensorium  Memory:Immediate Good  Judgment:Fair  Insight:Fair   Executive Functions  Concentration:Good  Attention Span:Good  Recall:Good  Fund of Knowledge:Good  Language:Good   Psychomotor Activity  Psychomotor Activity:Normal   Assets  Assets:Communication Skills; Desire for Improvement; Financial Resources/Insurance; Intimacy; Leisure Time; Physical Health; Resilience; Social Support   Sleep  Sleep:Poor  Number of hours: No data recorded  No data recorded  Physical Exam: Physical Exam Vitals and nursing note reviewed.  Constitutional:      Appearance: Normal appearance. She is well-developed and normal weight.  HENT:     Head: Normocephalic and atraumatic.     Nose: Nose normal.  Cardiovascular:     Rate and Rhythm: Normal rate.  Pulmonary:     Effort: Pulmonary effort is normal.  Musculoskeletal:        General: Normal range of motion.     Cervical back: Normal range of motion.  Skin:    General: Skin is warm and dry.  Neurological:     Mental Status: She is alert and oriented to person, place, and time.  Psychiatric:        Attention and Perception: Attention and perception normal.        Mood and Affect: Affect normal. Mood is depressed.        Speech: Speech normal.        Behavior: Behavior normal. Behavior is cooperative.        Thought Content: Thought content normal.        Cognition and Memory: Cognition and memory normal.    Review of Systems  Constitutional:  Negative.   HENT: Negative.    Eyes: Negative.   Respiratory: Negative.    Cardiovascular: Negative.   Gastrointestinal: Negative.   Genitourinary: Negative.   Musculoskeletal: Negative.   Skin: Negative.   Neurological: Negative.   Endo/Heme/Allergies: Negative.   Psychiatric/Behavioral:  Positive for depression and substance abuse.    Blood pressure (!) 163/107, pulse 75, temperature 97.9 F (36.6 C), temperature source Oral, resp. rate 16, SpO2 100 %. There is no height or weight on file to calculate BMI.  Musculoskeletal: Strength & Muscle Tone: within normal limits Gait & Station: normal Patient leans: N/A   [facility] MSE Discharge Disposition for Follow up and Recommendations: Based on my evaluation the patient does not appear to have an emergency medical condition and can be discharged with resources and follow up care in outpatient services for Medication Management, Substance Abuse Intensive Outpatient Program, and Individual Therapy Patient reviewed with Dr Bronwen Betters.  Follow up with substance use treatment resources provided. Reviewed meeting schedules for AA and NA in [place] areas.  Follow up with established primary care provider.  Follow up with mental health outpatient resources provided.  Lenard Lance, FNP 08/28/2021, 9:50 AM

## 2021-08-28 NOTE — BH Assessment (Addendum)
Comprehensive Clinical Assessment (CCA) Screening, Triage and Referral Note  08/28/2021 Sreeya Rossiter [id number]  Chief Complaint: Depression and Substance Use, Severe and   Visit Diagnosis: Depressive Disorder, Severe and Substance Use Disorder, Severe    Sharvon Radich is a 56 y/o female that presents to [facility]. She arrives voluntarily, escorted by a family friend. Referred by [facility] (Transitional [facility]) and [place] that [place] told her the [facility] would be able to meet her detox needs and provide her with dialysis treatment. She receives dialysis treatment Tuesdays, Thursdays, and Saturdays. Dialysis starts at 11:45 am at a facility in [facility].   States that she has a been a drug addict for 30 years, crack cocaine, heroin, and some alcohol use:  -Heroin:Age of first use for heroin is 56 yrs old. Average amount of use is "a lot, I use all day every day". Her usage is daily. Last use of heroin was this morning, amount of use this morning was a 1/2 gram.  -Crack Cocaine: Age of first use for crack cocaine is 56 yrs old. Average amount of use is "all day every day". Her usage is daily. Last use of crack cocaine was this morning, amount of use was 1/2 gram.  -Alcohol. Age of first use was 56 yrs old. She also uses alcohol occasionally. She typically drinks 2-3 wine cooler per usage. last use use was 08/26/2021.  Current withdrawal symptoms: Nausea, vomiting, irritability, hot/cold flashes, and sweats. No history of seizures and/or delirium tremors. Negative consequences of usage: housing, legal, family conflict, and finances.   Patient denies history of suicidal ideations. No current suicidal ideations. Denies prior history of suicide attempts and/or gestures. Denies history of self injurious. She does report a history of depressive symptoms: hopelessness, worthlessness, isolating self from others, tearful,  angry/irritable, lack of motivation to complete task, guilt, lack of bathing/grooming, and insomnia. States that her sleeping routine is poor and she typically doesn't sleep for a week at a time. Appetite is poor. She reports significant weight loss of over a 100 pounds in a month.   Patient denies homicidal ideations.She has a history of aggressive and/or assault ive behaviors. States, "When people say the wrong thing, I just fly off the handle". She reports that her sister is often a trigger for her. No current legal issues. She is not on probation, parole, and no upcoming court dates. She denies AVH's.   She has a history of inpatient psychiatric treatment. States that she has been to [facility] (Residential Treatment) but it was years ago.  Recently released from [facility], admitted Jul 16, 2021-August 15, 2021, after a unintentional overdose on heroin.   Patient is currently homeless. States that she sleeps where she can. However, has a lot of support from family.   Patient is noted as Routine.     Patient Reported Information How did you hear about [place]? No data recorded What Is the Reason for Your Visit/Call Today? Ania Krause is a 56 y/o female that presents to [facility]. She arrives voluntarily, escorted by a family friend. Referred by [facility] ([facility]) and [place] that [place] told her the [facility] would be able to meet her detox needs and provide her with dialysis treatment. She receives dialysis treatment Tuesdays, Thursdays, and Saturdays.     States that she has a been a drug addict for 30 years, crack cocaine, heroin,  and some alcohol use:   -Heroin:Age of first use for heroin is 56 yrs old. Average amount of use is "a lot, I use all day every day". Her usage is daily. Last use of heroin was this morning, amount of use this morning was a 1/2 gram.   -Crack Cocaine: Age of first use for crack cocaine is 56  yrs old. Average amount of use is "all day every day". Her usage is daily. Last use of crack cocaine was this morning, amount of use was 1/2 gram.   -Alcohol. Age of first use was 56 yrs old. She also uses alcohol occasionally. She typically drinks 2-3 wine cooler per usage. last use use was 08/26/2021.    Current withdrawal symptoms: Nausea, vomiting, irritability, hot/cold flashes, and sweats. No history of seizures and/or delirium tremors. Negative consequences of usage: housing, legal, family conflict, and finances.     Patient denies history of suicidal ideations. No current suicidal ideations. Denies prior history of suicide attempts and/or gestures. Denies history of self injurious. She does report a history of depressive symptoms: hopelessness, worthlessness, isolating self from others, tearful, angry/irritable, lack of motivation to complete task, guilt, lack of bathing/grooming, and insomnia. States that her sleeping routine is poor and she typically doesn't sleep for a week at a time. Appetite is poor. She reports significant weight loss of over a 100 pounds in a month.     Patient denies homicidal ideations.She has a history of aggressive and/or assault ive behaviors. States, "When people say the wrong thing, I just fly off the handle". She reports that her sister is often a trigger for her. No current legal issues. She is not on probation, parole, and no upcoming court dates. She denies AVH's.  How Long Has This Been Causing You Problems? > than 6 months  What Do You Feel Would Help You the Most Today? Alcohol or Drug Use Treatment; Treatment for Depression or other mood problem; Food Assistance; Housing Assistance   Have You Recently Had Any Thoughts About Hurting Yourself? No  Are You Planning to Commit Suicide/Harm Yourself At This time? No   Have you Recently Had Thoughts About Hurting Someone Karolee Ohs? No  Are You Planning to Harm Someone at This Time? No  Explanation: No data  recorded  Have You Used Any Alcohol or Drugs in the Past 24 Hours? Yes  How Long Ago Did You Use Drugs or Alcohol? No data recorded What Did You Use and How Much? States that she has a been a drug addict for 30 years, crack cocaine, heroin, and some alcohol use:   -Heroin:Age of first use for heroin is 56 yrs old. Average amount of use is "a lot, I use all day every day". Her usage is daily. Last use of heroin was this morning, amount of use this morning was a 1/2 gram.   -Crack Cocaine: Age of first use for crack cocaine is 56 yrs old. Average amount of use is "all day every day". Her usage is daily. Last use of crack cocaine was this morning, amount of use was 1/2 gram.   -Alcohol. Age of first use was 56 yrs old. She also uses alcohol occasionally. She typically drinks 2-3 wine cooler per usage. last use use was 08/26/2021.   Do You Currently Have a Therapist/Psychiatrist? No data recorded Name of Therapist/Psychiatrist: No data recorded  Have You Been Recently Discharged From Any Office Practice or Programs? No data recorded Explanation of Discharge From Practice/Program: No data  recorded   CCA Screening Triage Referral Assessment Type of Contact: No data recorded Telemedicine Service Delivery:   Is this Initial or Reassessment? No data recorded Date Telepsych consult ordered in [facility]:  No data recorded Time Telepsych consult ordered in [facility]:  No data recorded Location of Assessment: No data recorded Provider Location: No data recorded  Collateral Involvement: No data recorded  Does Patient Have a Court Appointed Legal Guardian? No data recorded Name and Contact of Legal Guardian: No data recorded If Minor and Not Living with Parent(s), Who has Custody? No data recorded Is CPS involved or ever been involved? No data recorded Is APS involved or ever been involved? No data recorded  Patient Determined To Be At Risk for Harm To Self or Others Based on Review of Patient Reported  Information or Presenting Complaint? No data recorded Method: No data recorded Availability of Means: No data recorded Intent: No data recorded Notification Required: No data recorded Additional Information for Danger to Others Potential: No data recorded Additional Comments for Danger to Others Potential: No data recorded Are There Guns or Other Weapons in Your Home? No data recorded Types of Guns/Weapons: No data recorded Are These Weapons Safely Secured?                            No data recorded Who Could Verify You Are Able To Have These Secured: No data recorded Do You Have any Outstanding Charges, Pending Court Dates, Parole/Probation? No data recorded Contacted To Inform of Risk of Harm To Self or Others: No data recorded  Does Patient Present under Involuntary Commitment? No data recorded IVC Papers Initial File Date: No data recorded  [place] of Residence: No data recorded  Patient Currently Receiving the Following Services: No data recorded  Determination of Need: Routine (7 days)   Options For Referral: Chemical Dependency Intensive Outpatient Therapy (CDIOP); Facility-Based Crisis; Medication Management; Outpatient Therapy (Residential or Transitional Housing.)   Discharge Disposition:     Melynda Ripple, Counselor

## 2021-12-14 DEATH — deceased
